# Patient Record
Sex: Male | Born: 1950 | Race: Black or African American | Hispanic: No | Marital: Single | Smoking: Never smoker
Health system: Southern US, Community
[De-identification: ages and names within clinical notes are randomized; demographics above are authoritative.]

## PROBLEM LIST (undated history)

## (undated) DIAGNOSIS — I10 Essential (primary) hypertension: Secondary | ICD-10-CM

## (undated) DIAGNOSIS — N529 Male erectile dysfunction, unspecified: Secondary | ICD-10-CM

## (undated) DIAGNOSIS — K649 Unspecified hemorrhoids: Secondary | ICD-10-CM

## (undated) DIAGNOSIS — E119 Type 2 diabetes mellitus without complications: Secondary | ICD-10-CM

## (undated) DIAGNOSIS — C801 Malignant (primary) neoplasm, unspecified: Secondary | ICD-10-CM

## (undated) HISTORY — DX: Male erectile dysfunction, unspecified: N52.9

## (undated) HISTORY — DX: Essential (primary) hypertension: I10

## (undated) HISTORY — DX: Type 2 diabetes mellitus without complications: E11.9

## (undated) HISTORY — DX: Unspecified hemorrhoids: K64.9

---

## 2012-01-20 ENCOUNTER — Encounter: Payer: Self-pay | Admitting: Gastroenterology

## 2012-02-21 ENCOUNTER — Ambulatory Visit: Payer: Self-pay | Admitting: Gastroenterology

## 2012-03-09 ENCOUNTER — Encounter: Payer: Self-pay | Admitting: Gastroenterology

## 2012-03-09 ENCOUNTER — Telehealth: Payer: Self-pay

## 2012-03-09 ENCOUNTER — Ambulatory Visit (INDEPENDENT_AMBULATORY_CARE_PROVIDER_SITE_OTHER): Payer: PRIVATE HEALTH INSURANCE | Admitting: Gastroenterology

## 2012-03-09 VITALS — BP 140/80 | HR 60 | Ht 68.0 in | Wt 143.0 lb

## 2012-03-09 DIAGNOSIS — R197 Diarrhea, unspecified: Secondary | ICD-10-CM

## 2012-03-09 DIAGNOSIS — K625 Hemorrhage of anus and rectum: Secondary | ICD-10-CM

## 2012-03-09 NOTE — Patient Instructions (Addendum)
You will be set up for a colonoscopy for bleeding, diarrhea.  Done at [facility] with propofol.  We will call you to schedule this procedure. You will have labs checked today in the basement lab.  Please head down after you check out with the front desk  (ova, parasites, stool culture, fecal lukocytes. Stool for C. Diff by PCR).

## 2012-03-09 NOTE — Telephone Encounter (Signed)
Pt needs to be scheduled for colon with MAC at [facility]

## 2012-03-09 NOTE — Progress Notes (Signed)
  HPI: This is a  shifty 61 year old man whom I am meeting for the site first time today  He has been having diarrhea and blood in stool.  This has been going on for 5 years.  Has never had colonoscopy. He thinks he has a hemorrhoid problem. He sees "worms" in his stool.  Looks like "very tiny" worms.  HE will have 20 diarrhea stools a day.  Overall he has lost 60 pounds in 5 years.  Not trying to lose weight.  At times he can be constipated as well.   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  Very scattered.  Avoids eye contact.    12/2011 labs show Hb 12.2, normal MCV, plt 426.  CMET normal   he asked for narcotic pain medicines for his anal discomfort, ran out of the pills given to him by urgent clinic    Review of systems: Pertinent positive and negative review of systems were noted in the above HPI section. Complete review of systems was performed and was otherwise normal.    Past Medical History  Diagnosis Date  . Hypertension   . DM (diabetes mellitus)   . Hemorrhoids   . ED (erectile dysfunction)     History reviewed. No pertinent past surgical history.  Current Outpatient Prescriptions  Medication Sig Dispense Refill  . atenolol (TENORMIN) 25 MG tablet Take 25 mg by mouth daily.      . hydrochlorothiazide (HYDRODIURIL) 25 MG tablet Take 25 mg by mouth daily.        Allergies as of 03/09/2012  . (No Known Allergies)    History reviewed. No pertinent family history.  History   Social History  . Marital Status: Widowed    Spouse Name: N/A    Number of Children: 2  . Years of Education: N/A   Occupational History  . student     rn   Social History Main Topics  . Smoking status: Never Smoker   . Smokeless tobacco: Never Used  . Alcohol Use: No  . Drug Use: No  . Sexually Active: Not on file   Other Topics Concern  . Not on file   Social History Narrative  . No narrative on file        Physical Exam: BP 140/80  Pulse 60  Ht 5\' 8"  (1.727 m)  Wt 143 lb (64.864 kg)  BMI 21.74 kg/m2 Constitutional: generally well-appearing Psychiatric: alert and oriented x3 Eyes: extraocular movements intact Mouth: oral pharynx moist, no lesions Neck: supple no lymphadenopathy Cardiovascular: heart regular rate and rhythm Lungs: clear to auscultation bilaterally Abdomen: soft, nontender, nondistended, no obvious ascites, no peritoneal signs, normal bowel sounds Extremities: no lower extremity edema bilaterally Skin: no lesions on visible extremities Rectal exam deferred for upcoming colonoscopy, patient agreed   Assessment and plan: 61 y.o. male with  5 years of diarrhea, rectal bleeding, anal discomfort  Need to proceed with colonoscopy at his soonest convenience.  He believes he sees worms in his stool which may indeed be true. He'll get stool testing today to check for that. 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

## 2012-03-12 ENCOUNTER — Other Ambulatory Visit: Payer: PRIVATE HEALTH INSURANCE

## 2012-03-12 ENCOUNTER — Other Ambulatory Visit: Payer: Self-pay | Admitting: Gastroenterology

## 2012-03-12 DIAGNOSIS — R197 Diarrhea, unspecified: Secondary | ICD-10-CM

## 2012-03-13 NOTE — Telephone Encounter (Signed)
Left message on machine to call back  

## 2012-03-15 LAB — OVA AND PARASITE SCREEN: OP: NONE SEEN

## 2012-03-15 NOTE — Telephone Encounter (Signed)
Left message on machine to call back  

## 2012-03-15 NOTE — Telephone Encounter (Signed)
Please call the patient. Stool testing all normal so far. No sign of infection (worms or otherwise). Should continue with the suggestions outlined at recent visit. Left message on machine to call back

## 2012-03-16 LAB — STOOL CULTURE

## 2012-03-16 NOTE — Telephone Encounter (Signed)
Unable to reach pt letter mailed 

## 2012-05-28 ENCOUNTER — Ambulatory Visit: Payer: PRIVATE HEALTH INSURANCE | Admitting: Gastroenterology

## 2012-07-19 ENCOUNTER — Telehealth: Payer: Self-pay | Admitting: Gastroenterology

## 2012-07-19 NOTE — Telephone Encounter (Signed)
Message copied by Arna Snipe on Thu Jul 19, 2012 11:57 AM ------      Message from: Donata Duff      Created: Mon May 28, 2012  2:52 PM       Do not bill

## 2013-04-03 ENCOUNTER — Telehealth: Payer: Self-pay | Admitting: Gastroenterology

## 2013-04-03 ENCOUNTER — Telehealth: Payer: Self-pay

## 2013-04-03 NOTE — Telephone Encounter (Signed)
Pt has been scheduled with Shanda Bumps for tomorrow at 9 am pt will arrive 845 am

## 2013-04-03 NOTE — Telephone Encounter (Signed)
ok 

## 2013-04-03 NOTE — Telephone Encounter (Signed)
Dr Christella Hartigan this pt is not the correct pt, I have placed a call to the correct pt and will file this in error

## 2013-04-03 NOTE — Telephone Encounter (Signed)
Patty, this is from [employer]. Can you please try to get in touch with Mr. Leece.  I saw him in 2013, recommended colonoscopy however he never scheduled it and did not show for office follow up appt.  He needs rov with me or extender.  Thanks   Daphene Jaeger     ----------------------------------------------------------------------------------------------------------------------------- Hi,  The below name is the patient I contacted you about earlier. He was seen in the [facility] last night and could not be examined due to a large rectal mass. The urologist who saw him wanted him referred to GI ASAP. He has [employer].   FYI--there is another patient in the system with the same birthday, first and last name, but it is not this patient. He is not currently listed in the EPIC system.   Please call and schedule the patient. [name] in [place] attempted to contact him without success this afternoon.   Please let me know if I can help.  Thanks,  [name], [name].  DOB 09/05/1950  phone # [phone]  Has BCBS

## 2013-04-03 NOTE — Telephone Encounter (Signed)
Brendan Mosley, this is from [employer]. Can you please try to get in touch with [name]. I saw him in 2013, recommended colonoscopy however he never scheduled it and did not show for office follow up appt. He needs rov with me or extender.  Thanks  Daphene Jaeger  -----------------------------------------------------------------------------------------------------------------------------  Hi,  The below name is the patient I contacted you about earlier. He was seen in the [facility] Screening Clinic last night and could not be examined due to a large rectal mass. The urologist who saw him wanted him referred to GI ASAP. He has [employer].   FYI--there is another patient in the system with the same birthday, first and last name, but it is not this patient. He is not currently listed in the EPIC system.   Please call and schedule the patient. [name] in [place] attempted to contact him without success this afternoon.   Please let me know if I can help.  Thanks,  [name], [name].  DOB 07-07-1950  phone # [phone]  Has BCBS            Encounter MyChart Messages    No messages in this encounter         Routing History

## 2013-04-04 ENCOUNTER — Encounter: Payer: Self-pay | Admitting: Nurse Practitioner

## 2013-04-04 ENCOUNTER — Ambulatory Visit (INDEPENDENT_AMBULATORY_CARE_PROVIDER_SITE_OTHER): Payer: BC Managed Care – PPO | Admitting: Nurse Practitioner

## 2013-04-04 VITALS — BP 140/78 | HR 65 | Ht 68.0 in | Wt 147.0 lb

## 2013-04-04 DIAGNOSIS — R198 Other specified symptoms and signs involving the digestive system and abdomen: Secondary | ICD-10-CM

## 2013-04-04 DIAGNOSIS — K625 Hemorrhage of anus and rectum: Secondary | ICD-10-CM

## 2013-04-04 DIAGNOSIS — R6889 Other general symptoms and signs: Secondary | ICD-10-CM

## 2013-04-04 DIAGNOSIS — K6289 Other specified diseases of anus and rectum: Secondary | ICD-10-CM

## 2013-04-04 MED ORDER — MOVIPREP 100 G PO SOLR: 1.0000 | Freq: Once | ORAL | Status: DC

## 2013-04-04 MED ORDER — HYDROCODONE-ACETAMINOPHEN 5-325 MG PO TABS: 1.0000 | ORAL_TABLET | Freq: Four times a day (QID) | ORAL | Status: DC | PRN

## 2013-04-04 NOTE — Patient Instructions (Addendum)
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  We have given you the prescriptions to take to your pharmacy, [facility] [address]  You have been scheduled for a colonoscopy with propofol. Please follow written instructions given to you at your visit today.  Please pick up your prep kit at the pharmacy within the next 1-3 days. If you use inhalers (even only as needed), please bring them with you on the day of your procedure.

## 2013-04-04 NOTE — Progress Notes (Signed)
  History of Present Illness:  Patient is a 62 year old male here for evaluation of rectal bleeding and an "obstruction" found during DRE for prostate screening. Patient doesn't have a PCP but heard about a free prostate cancer screening in [facility] which he attended. Apparently patient found to have an abnormal DRE. Patient reports a 5 year history of rectal bleeding with defecation. Over the last two months he has had excruciating rectal pain which is nearly constant. BMs have alternated between constipation and diarrhea for last five years.  No recent weight loss though 3 years ago he lost about 30 pounds.    Current Medications, Allergies, Past Medical History, Past Surgical History, Family History and Social History were reviewed in [employer] record.  Physical Exam: General: Thin black male in no acute distress Head: Normocephalic and atraumatic Eyes:  sclerae anicteric, conjunctiva pink  Ears: Normal auditory acuity Lungs: Clear throughout to auscultation Heart: Regular rate and rhythm Abdomen: Soft, non distended, non-tender. No masses, no hepatomegaly. Normal bowel sounds Rectal: no external lesions. Upon entering the anus there was a partially obstructing, tender mass located on anterior wall.  Musculoskeletal: Symmetrical with no gross deformities  Extremities: No edema  Neurological: Alert oriented x 4, grossly nonfocal Psychological:  Alert and cooperative. Normal mood and affect  Assessment and Recommendations:   62 year old black male with chronic rectal bleeding, now with rectal pain and a partially obstructing anal wall lesion on digital exam. Patient needs lower endoscopy for further evaluation. The risks, benefits, and alternatives to colonoscopy with possible biopsy and possible polypectomy were discussed with the patient and he consents to proceed. Procedure scheduled for tomorrow morning with Dr. Christella Hartigan.  It should be mentioned that a patient  with same name, history and demographics was seen by Dr. Christella Hartigan in September of last year for evaluation of rectal bleeding. Colonoscopy was recommended, patient apparently canceled procedure and not been seen since. This patient today adamantly denies EVER having been seen here.

## 2013-04-05 ENCOUNTER — Ambulatory Visit (INDEPENDENT_AMBULATORY_CARE_PROVIDER_SITE_OTHER)
Admission: RE | Admit: 2013-04-05 | Discharge: 2013-04-05 | Disposition: A | Discharge status: Home / Self Care | Payer: BC Managed Care – PPO | Source: Ambulatory Visit | Attending: Gastroenterology | Admitting: Gastroenterology

## 2013-04-05 ENCOUNTER — Other Ambulatory Visit (INDEPENDENT_AMBULATORY_CARE_PROVIDER_SITE_OTHER): Payer: BC Managed Care – PPO

## 2013-04-05 ENCOUNTER — Ambulatory Visit (AMBULATORY_SURGERY_CENTER): Payer: BC Managed Care – PPO | Admitting: Gastroenterology

## 2013-04-05 ENCOUNTER — Other Ambulatory Visit: Payer: BC Managed Care – PPO

## 2013-04-05 ENCOUNTER — Other Ambulatory Visit: Payer: Self-pay

## 2013-04-05 ENCOUNTER — Encounter: Payer: BC Managed Care – PPO | Admitting: Gastroenterology

## 2013-04-05 ENCOUNTER — Encounter: Payer: Self-pay | Admitting: Gastroenterology

## 2013-04-05 VITALS — BP 159/86 | HR 77 | Temp 98.4°F | Resp 30 | Ht 68.0 in | Wt 147.0 lb

## 2013-04-05 DIAGNOSIS — R198 Other specified symptoms and signs involving the digestive system and abdomen: Secondary | ICD-10-CM

## 2013-04-05 DIAGNOSIS — K6289 Other specified diseases of anus and rectum: Secondary | ICD-10-CM

## 2013-04-05 DIAGNOSIS — C801 Malignant (primary) neoplasm, unspecified: Secondary | ICD-10-CM | POA: Insufficient documentation

## 2013-04-05 HISTORY — DX: Malignant (primary) neoplasm, unspecified: C80.1

## 2013-04-05 LAB — CBC WITH DIFFERENTIAL/PLATELET
Basophils Absolute: 0.1 10*3/uL (ref 0.0–0.1)
Eosinophils Absolute: 0.2 10*3/uL (ref 0.0–0.7)
Eosinophils Relative: 1.8 % (ref 0.0–5.0)
Hemoglobin: 11.1 g/dL — ABNORMAL LOW (ref 13.0–17.0)
Lymphs Abs: 1.5 10*3/uL (ref 0.7–4.0)
MCHC: 33.2 g/dL (ref 30.0–36.0)
Monocytes Relative: 7.7 % (ref 3.0–12.0)
Neutro Abs: 7.5 10*3/uL (ref 1.4–7.7)
Neutrophils Relative %: 74.4 % (ref 43.0–77.0)
Platelets: 419 10*3/uL — ABNORMAL HIGH (ref 150.0–400.0)
RDW: 14.9 % — ABNORMAL HIGH (ref 11.5–14.6)
WBC: 10.1 10*3/uL (ref 4.5–10.5)

## 2013-04-05 LAB — COMPREHENSIVE METABOLIC PANEL
ALT: 20 U/L (ref 0–53)
Albumin: 3.8 g/dL (ref 3.5–5.2)
Alkaline Phosphatase: 61 U/L (ref 39–117)
CO2: 24 mEq/L (ref 19–32)
Glucose, Bld: 87 mg/dL (ref 70–99)
Potassium: 3.6 mEq/L (ref 3.5–5.1)
Sodium: 144 mEq/L (ref 135–145)
Total Bilirubin: 0.5 mg/dL (ref 0.3–1.2)
Total Protein: 6.9 g/dL (ref 6.0–8.3)

## 2013-04-05 MED ORDER — SODIUM CHLORIDE 0.9 % IV SOLN: 500.0000 mL | INTRAVENOUS | Status: DC

## 2013-04-05 MED ORDER — IOHEXOL 300 MG/ML  SOLN
100.0000 mL | Freq: Once | INTRAMUSCULAR | Status: AC | PRN
  Administered 2013-04-05: 100 mL via INTRAVENOUS

## 2013-04-05 NOTE — Progress Notes (Signed)
Patient sedated , yet remained restless throughout procedure. Not listening to instruction, resp. Deep even, abdoman soft to touch. past procedure drowsy opens eyes to name call and patient is peaceful

## 2013-04-05 NOTE — Op Note (Signed)
[facility] [address] [place], [address]   COLONOSCOPY PROCEDURE REPORT  PATIENT: Brendan Mosley, Brendan Mosley  MR#: [id number] BIRTHDATE: 1950-11-14 , 62  yrs. old GENDER: Male ENDOSCOPIST: Rachael Fee, MD PROCEDURE DATE:  04/05/2013 PROCEDURE:   Colonoscopy with biopsy First Screening Colonoscopy - Avg.  risk and is 50 yrs.  old or older - No.  Prior Negative Screening - Now for repeat screening. N/A  History of Adenoma - Now for follow-up colonoscopy & has been > or = to 3 yrs.  N/A  Polyps Removed Today? No.  Recommend repeat exam, <10 yrs? Yes.  High risk (family or personal hx). ASA CLASS:   Class II INDICATIONS:diarrhea, rectal mass. MEDICATIONS: Fentanyl 100 mcg IV, Versed 10 mg IV, and Benadryl 25 mg IV  DESCRIPTION OF PROCEDURE:   After the risks benefits and alternatives of the procedure were thoroughly explained, informed consent was obtained.  A digital rectal exam revealed a palpable rectal mass.   The [id number]  endoscope was introduced through the anus and advanced to the cecum, which was identified by both the appendix and ileocecal valve. No adverse events experienced.   The quality of the prep was fair.  The instrument was then slowly withdrawn as the colon was fully examined.  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  The lumen through the stricture was 7mm across, only able to be traversed by gastroscope.  The remaining colon mucosa showed melanosis changes but was otherwise normal.  This was limited by only a FAIR to POOR prep and so small lesions may have been missed.  Retroflexion was not performed. The time to cecum=9 minutes 50 seconds.  Withdrawal time=8 minutes 00 seconds.  The scope was withdrawn and the procedure completed. COMPLICATIONS: There were no complications.  ENDOSCOPIC IMPRESSION: There was a fairly tight circumferential rectal  stricture from the anus up to 8cm from the anal verge.  The lumen through the stricture was 7mm across, only able to be traversed by gastroscope. I suspect the stricture is from a large circumferential mass (vs. severe proctitis).  The stricture was biopsied extensively.  The remaining colon mucosa showed melanosis changes but was otherwise normal.  This was limited by only a FAIR to POOR prep and so small lesions may have been missed.  RECOMMENDATIONS: My office will set up staging CT scan (chest, abd, pelvis with PO and IV contrast), CEA level, other basic labs including CBC, CMET. My office will also begin referral process to medical and radiation oncology (will decide on surgical referral after staging complete).   eSigned:  Rachael Fee, MD 04/05/2013 8:34 AM

## 2013-04-05 NOTE — Progress Notes (Signed)
I agree with note.  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

## 2013-04-05 NOTE — Progress Notes (Addendum)
Patient did not have preoperative order for IV antibiotic SSI prophylaxis. [phone])   Patient is HIPPA. Requests that his Fiance knows NOTHING about his condition.  Patient did not experience any of the following events: a burn prior to discharge; a fall within the facility; wrong site/side/patient/procedure/implant event; or a hospital transfer or hospital admission upon discharge from the facility. [phone])  Patient was given contrast and sheet.  Explained that the nurse from 3rd floor would call him with details,

## 2013-04-05 NOTE — Patient Instructions (Signed)
YOU HAD AN ENDOSCOPIC PROCEDURE TODAY AT THE [facility]: Refer to the procedure report that was given to you for any specific questions about what was found during the examination.  If the procedure report does not answer your questions, please call your gastroenterologist to clarify.  If you requested that your care partner not be given the details of your procedure findings, then the procedure report has been included in a sealed envelope for you to review at your convenience later.  YOU SHOULD EXPECT: Some feelings of bloating in the abdomen. Passage of more gas than usual.  Walking can help get rid of the air that was put into your GI tract during the procedure and reduce the bloating. If you had a lower endoscopy (such as a colonoscopy or flexible sigmoidoscopy) you may notice spotting of blood in your stool or on the toilet paper. If you underwent a bowel prep for your procedure, then you may not have a normal bowel movement for a few days.  DIET: Your first meal following the procedure should be a light meal and then it is ok to progress to your normal diet.  A half-sandwich or bowl of soup is an example of a good first meal.  Heavy or fried foods are harder to digest and may make you feel nauseous or bloated.  Likewise meals heavy in dairy and vegetables can cause extra gas to form and this can also increase the bloating.  Drink plenty of fluids but you should avoid alcoholic beverages for 24 hours.  ACTIVITY: Your care partner should take you home directly after the procedure.  You should plan to take it easy, moving slowly for the rest of the day.  You can resume normal activity the day after the procedure however you should NOT DRIVE or use heavy machinery for 24 hours (because of the sedation medicines used during the test).    SYMPTOMS TO REPORT IMMEDIATELY: A gastroenterologist can be reached at any hour.  During normal business hours, 8:30 AM to 5:00 PM Monday through Friday,  call [phone].  After hours and on weekends, please call the GI answering service at [phone] who will take a message and have the physician on call contact you.   Following lower endoscopy (colonoscopy or flexible sigmoidoscopy):  Excessive amounts of blood in the stool  Significant tenderness or worsening of abdominal pains  Swelling of the abdomen that is new, acute  Fever of 100F or higher  FOLLOW UP: If any biopsies were taken you will be contacted by phone or by letter within the next 1-3 weeks.  Call your gastroenterologist if you have not heard about the biopsies in 3 weeks.  Our staff will call the home number listed on your records the next business day following your procedure to check on you and address any questions or concerns that you may have at that time regarding the information given to you following your procedure. This is a courtesy call and so if there is no answer at the home number and we have not heard from you through the emergency physician on call, we will assume that you have returned to your regular daily activities without incident.  SIGNATURES/CONFIDENTIALITY: You and/or your care partner have signed paperwork which will be entered into your electronic medical record.  These signatures attest to the fact that that the information above on your After Visit Summary has been reviewed and is understood.  Full responsibility of the confidentiality of this   discharge information lies with you and/or your care-partner.   Our office will set up a CT of the chest, abd, pelvis with contrast.  Labs will be done, and an oncology referral with be scheduled.  The RN from the 3rd floor will contact you with the details.  You will go to the lab today for your labwork.

## 2013-04-05 NOTE — Progress Notes (Signed)
You have been scheduled for a CT scan of the abdomen and pelvis at [facility] ([address]---this is in the same building as [facility]).   You are scheduled on TODAY at 3 pm. You should arrive 15 minutes prior to your appointment time for registration. Please follow the written instructions below on the day of your exam:  WARNING: IF YOU ARE ALLERGIC TO IODINE/X-RAY DYE, PLEASE NOTIFY RADIOLOGY IMMEDIATELY AT [phone]! YOU WILL BE GIVEN A 13 HOUR PREMEDICATION PREP.  1) Do not eat or drink anything after 11 am (4 hours prior to your test) 2) You have been given 2 bottles of oral contrast to drink. The solution may taste better if refrigerated, but do NOT add ice or any other liquid to this solution. Shake well before drinking.    Drink 1 bottle of contrast @ 1 pm (2 hours prior to your exam)  Drink 1 bottle of contrast @ 2 pm (1 hour prior to your exam)  You may take any medications as prescribed with a small amount of water except for the following: Metformin, Glucophage, Glucovance, Avandamet, Riomet, Fortamet, Actoplus Met, Janumet, Glumetza or Metaglip. The above medications must be held the day of the exam AND 48 hours after the exam.  The purpose of you drinking the oral contrast is to aid in the visualization of your intestinal tract. The contrast solution may cause some diarrhea. Before your exam is started, you will be given a small amount of fluid to drink. Depending on your individual set of symptoms, you may also receive an intravenous injection of x-ray contrast/dye. Plan on being at [facility] for 30 minutes or long, depending on the type of exam you are having performed.  This test typically takes 30-45 minutes to complete.  If you have any questions regarding your exam or if you need to reschedule, you may call the CT department at [phone] between the hours of 8:00 am and 5:00 pm,  Monday-Friday.  ________________________________________________________________________ Pt has been notified and instructed

## 2013-04-06 LAB — CEA: CEA: 0.9 ng/mL (ref 0.0–5.0)

## 2013-04-08 ENCOUNTER — Encounter: Payer: Self-pay | Admitting: Radiation Oncology

## 2013-04-08 ENCOUNTER — Telehealth: Payer: Self-pay | Admitting: *Deleted

## 2013-04-08 NOTE — Progress Notes (Signed)
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   Past/Anticipated interventions by urology, if any: Past/Anticipated interventions by medical oncology, if any: free prostate  screening for PSA in [place] =abnormal DRE   Weight changes, if any:no,  lost 30 lbs in past 2-3 years,  not trying to lose weight, none in last month,   Bowel/Bladder complaints, if any: constipation/diarrhea with  Chronic rectal bleeding, states 10-15  diarrhea stools day now, doesn't take imodium ad, sometimes constipation, blood in stool every other time stated Nausea/Vomiting, if any:   Pain issues, if any:  excruciating rectal pain constant SAFETY ISSUES:no  Prior radiation? no  Pacemaker/ICD? no  Is the patient on methotrexate? no  Current Complaints / other details:  Widowed, 2 children,  Has fiance doesn't want them to know of his current condition, inever smoker,no alcohol or drug abuse,   CT abd/pelvis 04/05/13:1. Marked irregular circumferential wall thickening of the rectum with an associated mass measuring up to 2.2 cm concerning for primary rectal carcinoma. There are multiple enlarged perirectal  lymph nodes concerning for local regional metastatic disease. Additionally there is an regular low-attenuation lesion within the anterior right hepatic lobe concerning for hepatic metastatic disease. 2. Small 2 cm fluid collection with a few foci of gas adjacent to the distal rectum/ anus may be sequelae of necrotic tumor versus small perirectal abscess. 3. There is mild dilation of the pancreatic duct throughout the pancreatic parenchyma measuring up to 5 mm within the region of the  head and uncinate process. No causative etiology is identified.Marland Kitchen

## 2013-04-08 NOTE — Telephone Encounter (Signed)
  Follow up Call-  Call back number 04/05/2013  Post procedure Call Back phone  # [phone]  do not leave a message  Permission to leave phone message No    No answer; no message left

## 2013-04-09 ENCOUNTER — Telehealth: Payer: Self-pay | Admitting: *Deleted

## 2013-04-09 ENCOUNTER — Telehealth: Payer: Self-pay | Admitting: Gastroenterology

## 2013-04-09 NOTE — Telephone Encounter (Signed)
Spoke with patient by phone and confirmed appointments with Dr. Mitzi Hansen and Dr. Truett Perna.  Directions, Contact names and numbers were provided.

## 2013-04-09 NOTE — Telephone Encounter (Signed)
Pt received a call from Vicie Mutters at the cancer center he was given that information and will call her back

## 2013-04-10 ENCOUNTER — Ambulatory Visit
Admission: RE | Admit: 2013-04-10 | Discharge: 2013-04-10 | Disposition: A | Discharge status: Home / Self Care | Payer: BC Managed Care – PPO | Source: Ambulatory Visit | Attending: Radiation Oncology | Admitting: Radiation Oncology

## 2013-04-10 ENCOUNTER — Encounter: Payer: Self-pay | Admitting: Radiation Oncology

## 2013-04-10 VITALS — BP 151/75 | HR 66 | Temp 99.0°F | Resp 16 | Wt 147.9 lb

## 2013-04-10 DIAGNOSIS — E119 Type 2 diabetes mellitus without complications: Secondary | ICD-10-CM | POA: Insufficient documentation

## 2013-04-10 DIAGNOSIS — Z79899 Other long term (current) drug therapy: Secondary | ICD-10-CM | POA: Insufficient documentation

## 2013-04-10 DIAGNOSIS — K6289 Other specified diseases of anus and rectum: Secondary | ICD-10-CM

## 2013-04-10 DIAGNOSIS — K7689 Other specified diseases of liver: Secondary | ICD-10-CM | POA: Insufficient documentation

## 2013-04-10 DIAGNOSIS — C20 Malignant neoplasm of rectum: Secondary | ICD-10-CM | POA: Insufficient documentation

## 2013-04-10 DIAGNOSIS — I10 Essential (primary) hypertension: Secondary | ICD-10-CM | POA: Insufficient documentation

## 2013-04-10 HISTORY — DX: Malignant (primary) neoplasm, unspecified: C80.1

## 2013-04-10 MED ORDER — HYDROCODONE-ACETAMINOPHEN 5-325 MG PO TABS: 2.0000 | ORAL_TABLET | Freq: Four times a day (QID) | ORAL | Status: DC | PRN

## 2013-04-10 NOTE — Progress Notes (Signed)
Please see the Nurse Progress Note in the MD Initial Consult Encounter for this patient. 

## 2013-04-10 NOTE — Progress Notes (Signed)
Radiation Oncology         [phone] ________________________________  Name: Brendan Mosley MRN: [id number]  Date: 04/10/2013  DOB: [birth year]  CC:No PCP Per Patient  Rachael Fee, MD   G. Rolm Baptise, M.D.  REFERRING PHYSICIAN: Rachael Fee, MD   DIAGNOSIS: The primary encounter diagnosis was Rectal mass. A diagnosis of Malignant neoplasm of rectum was also pertinent to this visit.   HISTORY OF PRESENT ILLNESS::Brendan Mosley is a 62 y.o. male who is seen for an initial consultation visit. The patient has had a history of rectal bleeding for a prolonged period of time, possibly up to 5 years. More recently he has had some bowel changes alternating between constipation and diarrhea as well as significant rectal pain. He states that this pain is now nearly constant. The patient was found to have an abnormal digital rectal exam and he underwent a colonoscopy with biopsy on 04/05/2013. This showed a fairly tight circumferential rectal stricture from the anus to 8 cm proximal to the anal her. The stricture was biopsied extensively and has returned positive for adenocarcinoma.  Imaging has included a CT scan of the chest abdomen and pelvis. Marketed irregular wall thickening of the rectum is present with multiple enlarged perirectal lymph nodes. There also is a low attenuation lesion within the anterior right hepatic lobe which is also concerning for hepatic metastatic disease. Mild dilatation of the pancreatic duct is also present measuring up to 5 mm without any positive in the allergy seen.  The patient for is seen today for consideration of possible radiation treatment as part of his overall treatment plan. He is also scheduled to see medical oncology next week.   PREVIOUS RADIATION THERAPY: No   PAST MEDICAL HISTORY:  has a past medical history of Hemorrhoids; ED (erectile dysfunction); Hypertension; Cancer (04/05/13); and DM (diabetes mellitus).     PAST SURGICAL  HISTORY:No past surgical history on file.   FAMILY HISTORY: family history includes Hypertension in his father.   SOCIAL HISTORY:  reports that he has never smoked. He has never used smokeless tobacco. He reports that he does not drink alcohol or use illicit drugs.   ALLERGIES: Review of patient's allergies indicates no known allergies.   MEDICATIONS:  Current Outpatient Prescriptions  Medication Sig Dispense Refill  . atenolol (TENORMIN) 25 MG tablet Take 25 mg by mouth daily.      . hydrochlorothiazide (HYDRODIURIL) 25 MG tablet Take 25 mg by mouth daily.      Marland Kitchen HYDROcodone-acetaminophen (NORCO/VICODIN) 5-325 MG per tablet Take 2 tablets by mouth every 6 (six) hours as needed for pain.  60 tablet  0   No current facility-administered medications for this encounter.     REVIEW OF SYSTEMS:  A 15 point review of systems is documented in the electronic medical record. This was obtained by the nursing staff. However, I reviewed this with the patient to discuss relevant findings and make appropriate changes.  Pertinent items are noted in HPI.    PHYSICAL EXAM:  weight is 147 lb 14.4 oz (67.087 kg). His oral temperature is 99 F (37.2 C). His blood pressure is 151/75 and his pulse is 66. His respiration is 16 and oxygen saturation is 99%.   General: Well-developed, in no acute distress HEENT: Normocephalic, atraumatic; oral cavity clear Neck: Supple without any lymphadenopathy Cardiovascular: Regular rate and rhythm Respiratory: Clear to auscultation bilaterally GI: Soft, nontender, normal bowel sounds Extremities: No edema present Neuro: No focal deficits Rectal: The patient  has palpable tumor extending to the anal verge. Circumferential tumor which is very tight and was painful on exam. I was unable to complete a digital rectal exam. No tumor externally extending to the anal canal.    LABORATORY DATA:  Lab Results  Component Value Date   WBC 10.1 04/05/2013   HGB 11.1*  04/05/2013   HCT 33.5* 04/05/2013   MCV 82.8 04/05/2013   PLT 419.0* 04/05/2013   Lab Results  Component Value Date   NA 144 04/05/2013   K 3.6 04/05/2013   CL 111 04/05/2013   CO2 24 04/05/2013   Lab Results  Component Value Date   ALT 20 04/05/2013   AST 20 04/05/2013   ALKPHOS 61 04/05/2013   BILITOT 0.5 04/05/2013     CEA: 0.9  RADIOGRAPHY: Ct Chest W Contrast  04/05/2013   ADDENDUM REPORT: 04/05/2013 17:22  ADDENDUM: Addition to impression:  4. There is a 1.2 cm indeterminate left adrenal nodule.   Electronically Signed   By: Annia Belt M.D.   On: 04/05/2013 17:22   04/05/2013   CLINICAL DATA:  Rectal mass  EXAM: CT CHEST, ABDOMEN, AND PELVIS WITH CONTRAST  TECHNIQUE: Multidetector CT imaging of the chest, abdomen and pelvis was performed following the standard protocol during bolus administration of intravenous contrast.  CONTRAST:  OMNIPAQUE IOHEXOL 300 MG/ML  SOLN  COMPARISON:  None  FINDINGS: CT CHEST FINDINGS  Visualized neck base is unremarkable. No enlarged axillary, mediastinal or hilar lymphadenopathy. Normal heart size. No pericardial effusion.  Central airways are patent. No consolidative or nodular pulmonary opacities. No pleural effusion pneumothorax.  CT ABDOMEN AND PELVIS FINDINGS  Within the right hepatic lobe there is a 2.7 x 1.9 cm irregular low-attenuation lesion (image 59; series 2). Portal and hepatic veins are patent. Gallbladder is unremarkable. The spleen is unremarkable. There is mild dilatation of the pancreatic duct without definitive causing etiology identified. This measures up to 5 mm within the region of the pancreatic head and uncinate process. There is a 1.2 cm left adrenal nodule. Kidneys enhance symmetrically with contrast. No hydronephrosis.  Normal caliber abdominal aorta. No retroperitoneal lymphadenopathy. Urinary bladder is unremarkable. Prostate is enlarged measuring up to 4.5 cm.  There is marked irregular circumferential wall thickening  of the rectum with a 2.2 x 1.4 cm mass emanating from the left lateral aspect of the rectum. There is a 1.1 cm enlarged perirectal lymph node (image 103; series 2) and more superiorly a 1.1 cm perirectal lymph node (image 98; series 2). Normal appendix. Stool within the cecum and ascending colon. No evidence for bowel obstruction. No free intraperitoneal air. Additionally along the right lateral aspect of the rectum there is a 2 cm low attenuation collection with all a few foci of gas (image 120; series 2).  Lower lumbar spine degenerative change. Bilateral hip joint degenerative change.  IMPRESSION: 1. Marked irregular circumferential wall thickening of the rectum with an associated mass measuring up to 2.2 cm concerning for primary rectal carcinoma. There are multiple enlarged perirectal lymph nodes concerning for local regional metastatic disease. Additionally there is an regular low-attenuation lesion within the anterior right hepatic lobe concerning for hepatic metastatic disease. 2. Small 2 cm fluid collection with a few foci of gas adjacent to the distal rectum/ anus may be sequelae of necrotic tumor versus small perirectal abscess. 3. There is mild dilation of the pancreatic duct throughout the pancreatic parenchyma measuring up to 5 mm within the region of the head and  uncinate process. No causative etiology is identified. Recommend correlation with laboratory analysis and possible MRI/ MRCP as clinically indicated. These results will be called to the ordering clinician or representative by the Radiologist Assistant, and communication documented in the PACS Dashboard.  Electronically Signed: By: Annia Belt M.D. On: 04/05/2013 16:15   Ct Abdomen Pelvis W Contrast  04/05/2013   ADDENDUM REPORT: 04/05/2013 17:22  ADDENDUM: Addition to impression:  4. There is a 1.2 cm indeterminate left adrenal nodule.   Electronically Signed   By: Annia Belt M.D.   On: 04/05/2013 17:22   04/05/2013   CLINICAL DATA:   Rectal mass  EXAM: CT CHEST, ABDOMEN, AND PELVIS WITH CONTRAST  TECHNIQUE: Multidetector CT imaging of the chest, abdomen and pelvis was performed following the standard protocol during bolus administration of intravenous contrast.  CONTRAST:  OMNIPAQUE IOHEXOL 300 MG/ML  SOLN  COMPARISON:  None  FINDINGS: CT CHEST FINDINGS  Visualized neck base is unremarkable. No enlarged axillary, mediastinal or hilar lymphadenopathy. Normal heart size. No pericardial effusion.  Central airways are patent. No consolidative or nodular pulmonary opacities. No pleural effusion pneumothorax.  CT ABDOMEN AND PELVIS FINDINGS  Within the right hepatic lobe there is a 2.7 x 1.9 cm irregular low-attenuation lesion (image 59; series 2). Portal and hepatic veins are patent. Gallbladder is unremarkable. The spleen is unremarkable. There is mild dilatation of the pancreatic duct without definitive causing etiology identified. This measures up to 5 mm within the region of the pancreatic head and uncinate process. There is a 1.2 cm left adrenal nodule. Kidneys enhance symmetrically with contrast. No hydronephrosis.  Normal caliber abdominal aorta. No retroperitoneal lymphadenopathy. Urinary bladder is unremarkable. Prostate is enlarged measuring up to 4.5 cm.  There is marked irregular circumferential wall thickening of the rectum with a 2.2 x 1.4 cm mass emanating from the left lateral aspect of the rectum. There is a 1.1 cm enlarged perirectal lymph node (image 103; series 2) and more superiorly a 1.1 cm perirectal lymph node (image 98; series 2). Normal appendix. Stool within the cecum and ascending colon. No evidence for bowel obstruction. No free intraperitoneal air. Additionally along the right lateral aspect of the rectum there is a 2 cm low attenuation collection with all a few foci of gas (image 120; series 2).  Lower lumbar spine degenerative change. Bilateral hip joint degenerative change.  IMPRESSION: 1. Marked irregular  circumferential wall thickening of the rectum with an associated mass measuring up to 2.2 cm concerning for primary rectal carcinoma. There are multiple enlarged perirectal lymph nodes concerning for local regional metastatic disease. Additionally there is an regular low-attenuation lesion within the anterior right hepatic lobe concerning for hepatic metastatic disease. 2. Small 2 cm fluid collection with a few foci of gas adjacent to the distal rectum/ anus may be sequelae of necrotic tumor versus small perirectal abscess. 3. There is mild dilation of the pancreatic duct throughout the pancreatic parenchyma measuring up to 5 mm within the region of the head and uncinate process. No causative etiology is identified. Recommend correlation with laboratory analysis and possible MRI/ MRCP as clinically indicated. These results will be called to the ordering clinician or representative by the Radiologist Assistant, and communication documented in the PACS Dashboard.  Electronically Signed: By: Annia Belt M.D. On: 04/05/2013 16:15       IMPRESSION: The patient has a new diagnosis of adenocarcinoma of the rectum. The patient appears to be presenting with at least locally advanced disease  with evidence of regional adenopathy on CT imaging. One suspicious area within the liver was present. I would like to proceed with additional imaging including a PET scan to help clarify the liver lesion and also I believe he is at significant risk for other sites of distant disease as well. Notably however, the patient's CEA level was low at 0.9.   I discussed this with the patient. The patient does not appear to have grade insight into his disease at this time. We discussed that a curative approach versus a palliative approach would depend on his further workup. We did discuss therefore that this can be potentially life-threatening.  I did discuss with the patient a potential 5-1/2 week course of treatment consisting of  neoadjuvant chemoradiotherapy followed by surgical resection as a standard treatment plan for curative intent. We discussed some of the details of treatment including the benefit of radiotherapy in this setting in addition to the possible side effects and risks. All of the patient's questions were answered.   PLAN:  -PET scan as soon as possible -Simulation in the near future: I believe that the patient will benefit from radiation treatment either curative or palliative -Medical oncology appointment next week -Discussion in multidisciplinary GI conference next Wednesday    I spent 45 minutes face to face with the patient and more than 50% of that time was spent in counseling and/or coordination of care.    ________________________________   Radene Gunning, MD, PhD

## 2013-04-11 ENCOUNTER — Telehealth: Payer: Self-pay | Admitting: *Deleted

## 2013-04-11 NOTE — Telephone Encounter (Signed)
Called patient to inform of test, lvm for a return call 

## 2013-04-12 ENCOUNTER — Other Ambulatory Visit: Payer: Self-pay | Admitting: Radiation Oncology

## 2013-04-12 ENCOUNTER — Encounter (HOSPITAL_COMMUNITY): Payer: Self-pay

## 2013-04-12 ENCOUNTER — Ambulatory Visit (HOSPITAL_COMMUNITY)
Admission: RE | Admit: 2013-04-12 | Discharge: 2013-04-12 | Disposition: A | Discharge status: Home / Self Care | Payer: BC Managed Care – PPO | Source: Ambulatory Visit | Attending: Radiation Oncology | Admitting: Radiation Oncology

## 2013-04-12 ENCOUNTER — Encounter: Payer: Self-pay | Admitting: Radiation Oncology

## 2013-04-12 DIAGNOSIS — M19019 Primary osteoarthritis, unspecified shoulder: Secondary | ICD-10-CM | POA: Insufficient documentation

## 2013-04-12 DIAGNOSIS — K6289 Other specified diseases of anus and rectum: Secondary | ICD-10-CM

## 2013-04-12 DIAGNOSIS — J323 Chronic sphenoidal sinusitis: Secondary | ICD-10-CM | POA: Insufficient documentation

## 2013-04-12 DIAGNOSIS — C20 Malignant neoplasm of rectum: Secondary | ICD-10-CM | POA: Insufficient documentation

## 2013-04-12 DIAGNOSIS — R599 Enlarged lymph nodes, unspecified: Secondary | ICD-10-CM | POA: Insufficient documentation

## 2013-04-12 DIAGNOSIS — K769 Liver disease, unspecified: Secondary | ICD-10-CM | POA: Insufficient documentation

## 2013-04-12 DIAGNOSIS — J320 Chronic maxillary sinusitis: Secondary | ICD-10-CM | POA: Insufficient documentation

## 2013-04-12 LAB — GLUCOSE, CAPILLARY: Glucose-Capillary: 102 mg/dL — ABNORMAL HIGH (ref 70–99)

## 2013-04-12 MED ORDER — FLUDEOXYGLUCOSE F - 18 (FDG) INJECTION
19.6000 | Freq: Once | INTRAVENOUS | Status: AC | PRN
  Administered 2013-04-12: 19.6 via INTRAVENOUS

## 2013-04-12 NOTE — Progress Notes (Signed)
I reviewed the patient's PET scan from today. The liver lesion appears hypermetabolic. No other areas of suspicious activity with regards to distant disease. I have ordered a biopsy of this lesion.

## 2013-04-16 ENCOUNTER — Encounter (HOSPITAL_COMMUNITY): Payer: Self-pay | Admitting: Pharmacy Technician

## 2013-04-16 ENCOUNTER — Encounter: Payer: Self-pay | Admitting: Oncology

## 2013-04-16 ENCOUNTER — Telehealth: Payer: Self-pay | Admitting: Oncology

## 2013-04-16 ENCOUNTER — Ambulatory Visit (HOSPITAL_BASED_OUTPATIENT_CLINIC_OR_DEPARTMENT_OTHER): Payer: BC Managed Care – PPO | Admitting: Oncology

## 2013-04-16 ENCOUNTER — Ambulatory Visit: Payer: BC Managed Care – PPO

## 2013-04-16 ENCOUNTER — Ambulatory Visit: Payer: BC Managed Care – PPO | Admitting: Nutrition

## 2013-04-16 VITALS — BP 170/84 | HR 68 | Temp 97.8°F | Resp 18 | Ht 68.0 in | Wt 145.0 lb

## 2013-04-16 DIAGNOSIS — C20 Malignant neoplasm of rectum: Secondary | ICD-10-CM

## 2013-04-16 DIAGNOSIS — G893 Neoplasm related pain (acute) (chronic): Secondary | ICD-10-CM

## 2013-04-16 DIAGNOSIS — R599 Enlarged lymph nodes, unspecified: Secondary | ICD-10-CM

## 2013-04-16 DIAGNOSIS — K7689 Other specified diseases of liver: Secondary | ICD-10-CM

## 2013-04-16 NOTE — Telephone Encounter (Signed)
gv pt appt schedule for October and November. Pt could not come for f/u 11/3 and was scheduled for 11/4.

## 2013-04-16 NOTE — Progress Notes (Signed)
This is a 62 year old male diagnosed with rectal mass.  He is a patient of Dr. Truett Perna.  Past medical history includes hypertension.  Patient denies history of diabetes.  Medications were reviewed.  Labs were reviewed.  Height: 68 inches. Weight: 145 pounds on October 21. Usual body weight: 160-165 pounds. BMI: 22.05.  Patient reports weight loss but denies nutritional side effects at this time.  He has been eating a high fiber diet.  Nutrition diagnosis: Food and nutrition related knowledge deficit related to diagnosis of rectal mass and associated treatments as evidenced by no prior need for nutrition related information.  Intervention: Patient was educated on diet for avoiding bowel obstruction.  He was encouraged to follow a low fiber diet with increased fluids.  I have asked him to try to eat small meals 5-6 times daily.  Patient was provided with fact sheet.  Questions were answered.  Teach back method used.  Contact information was given.  Monitoring, evaluation, goals: Patient will tolerate small, frequent meals eating a low fiber diet to avoid bowel obstruction and achieve weight maintenance.  Next visit: I will follow patient as needed throughout treatment.

## 2013-04-16 NOTE — Progress Notes (Signed)
Met with Talbert Cage. Explained role of nurse navigator. Educational information provided on colorectal cancer.  Referral made to dietician for diet education and he was seen today. [facility] resources provided to patient, including SW service information.   Patient lives alone and stated he had no family members or support persons living in the area.  SW referral made and A. Elmore, who will see and assess pt at upcoming visit.  Contact names and phone numbers were provided for entire [facility] team.  Teach back method was used.  Will continue to follow as needed.

## 2013-04-16 NOTE — Progress Notes (Signed)
Checked in new pt with no financial concerns. °

## 2013-04-16 NOTE — Progress Notes (Signed)
[facility] New Patient Consult   Referring MD: Yerik Zeringue 62 y.o.  04-14-1951    Reason for Referral: Rectal cancer     HPI: He reports a one-year history of rectal pain. He was evaluated for a prostate screening and was found to have a rectal mass. He was referred to gastroenterology and a partially obstructing mass was located at the anterior anus. He was taken to a colonoscopy by Dr. Christella Hartigan on 04/05/2013. A circumferential rectal stricture was noted from the anus up to 8 cm. The lumen could only be traversed with a gastroscope. The stricture was biopsied. The remaining colon mucosa showed melanosis but was otherwise normal. The pathology ([id number]) was positive for adenocarcinoma. He was referred for CTs of the chest, abdomen, and pelvis on 04/05/2013. A 2.7 cm irregular low-attenuation lesion is noted in the right hepatic lobe. Mild dilatation of the pancreatic duct. 1.2 cm left adrenal nodule. No peritoneal lymphadenopathy the enlarged prostate. Irregular wall thickening of the rectum with  1.1 cm perirectal lymph nodes. 2 cm low attenuation collection with a few foci of gas at the right lateral aspect of the rectum.  He was referred to Dr. Mitzi Hansen to consider neoadjuvant radiation. A staging PET scan on 04/12/2013 confirmed a hypermetabolic 2.2 cm mass in the liver. A long segment of circumferential abnormal wall thickening was noted in the rectum. No gas was felt to reflect ulceration causing localized extraluminal gas and appear contained. A mildly hypermetabolic right. Lymph node measured 0.9 x 1.1 cm.    Past Medical History  Diagnosis Date  . Hemorrhoids   . ED (erectile dysfunction)   . Hypertension   . Cancer 04/05/13    rectal  . DM (diabetes mellitus)     Past surgical history: None  Family History  Problem Relation Age of Onset  . Hypertension Father     Current outpatient prescriptions:HYDROcodone-acetaminophen  (NORCO/VICODIN) 5-325 MG per tablet, Take 2 tablets by mouth every 6 (six) hours as needed for pain., Disp: , Rfl:   Allergies: No Known Allergies  Social History: He is a Theatre stage manager at [employer], he [place] he does not use tobacco or alcohol. He is widowed. No transfusion history. No risk factor for HIV or hepatitis.  ROS:   Positives include: 15 pound weight loss over the past year, rectal bleeding, constipation, rectal pain  A complete ROS was otherwise negative.  Physical Exam:  Blood pressure 170/84, pulse 68, temperature 97.8 F (36.6 C), temperature source Oral, resp. rate 18, height 5\' 8"  (1.727 m), weight 145 lb (65.772 kg).  HEENT: Multiple missing teeth, oropharynx without visible mass, neck without mass Lungs: Clear bilaterally Cardiac: Regular rate and rhythm Abdomen: No hepatomegaly, nontender, no mass GU: Testes without mass  Vascular: No leg edema Lymph nodes: No cervical, supraclavicular, axillar, or inguinal nodes Neurologic: Alert and oriented, the motor exam appears intact in the upper and lower extremities Skin: No rash Musculoskeletal: No spine tenderness  Rectal: There is a firm anterior mass beginning just proximal to the anal verge, 1 cm or less. The mass is obstructing and could not be passed with the examination finger.   LAB:  CBC  Lab Results  Component Value Date   WBC 10.1 04/05/2013   HGB 11.1* 04/05/2013   HCT 33.5* 04/05/2013   MCV 82.8 04/05/2013   PLT 419.0* 04/05/2013   ANC 7.5  CMP      Component Value Date/Time   NA  144 04/05/2013 0937   K 3.6 04/05/2013 0937   CL 111 04/05/2013 0937   CO2 24 04/05/2013 0937   GLUCOSE 87 04/05/2013 0937   BUN 17 04/05/2013 0937   CREATININE 0.8 04/05/2013 0937   CALCIUM 8.8 04/05/2013 0937   PROT 6.9 04/05/2013 0937   ALBUMIN 3.8 04/05/2013 0937   AST 20 04/05/2013 0937   ALT 20 04/05/2013 0937   ALKPHOS 61 04/05/2013 0937   BILITOT 0.5 04/05/2013 0937   CEA  0.9  Radiology: As per history of present illness, I reviewed the PET scan from 04/12/2013 and the CT from 04/05/2013 with Mr. Cossin    Assessment/Plan:   1. Rectal cancer, clinical stage IV, presenting with a rectal mass, perirectal lymphadenopathy, an and a hypermetabolic liver lesion  2. Pain/constipation secondary to #1  3. History of hypertension   Disposition:   I discussed the diagnosis of rectal cancer and reviewed the staging studies with Mr. Frankowski. He understands the liver lesion likely represents a metastasis. He has been referred for a biopsy of this lesion.  His case will be presented at the GI tumor conference on 04/17/2013. My initial impression is to recommend concurrent chemotherapy/radiation for treatment of the primary rectal tumor and palliation of his current symptoms. We can consider systemic and local options for treatment of the liver lesion at the completion of neoadjuvant therapy. We will make a surgical referral as indicated based on the GI tumor conference discussion.  I recommend concurrent capecitabine and radiation. We reviewed the potential toxicities associated with capecitabine including the chance for mucositis, diarrhea, and hematologic toxicity. We discussed the rash, hyperpigmentation, and hand/foot syndrome associated with capecitabine. He will attend a chemotherapy teaching class.  I anticipate capecitabine and radiation starting on 04/29/2013. He will return for an office visit and nadir CBC on 05/09/2013.  We recommended he begin a stool softener.  Approximately 50 minutes were spent with patient today. The majority of the time was used for counseling and coordination of care.  Gwynevere Lizana 04/16/2013, 5:19 PM

## 2013-04-17 ENCOUNTER — Encounter (HOSPITAL_COMMUNITY): Admission: RE | Admit: 2013-04-17 | Payer: BC Managed Care – PPO | Source: Ambulatory Visit

## 2013-04-17 ENCOUNTER — Telehealth: Payer: Self-pay | Admitting: *Deleted

## 2013-04-17 ENCOUNTER — Other Ambulatory Visit: Payer: Self-pay | Admitting: Radiology

## 2013-04-17 MED ORDER — DOCUSATE SODIUM 100 MG PO CAPS: 100.0000 mg | ORAL_CAPSULE | Freq: Two times a day (BID) | ORAL | Status: DC

## 2013-04-17 NOTE — Telephone Encounter (Signed)
CALLED PATIENT TO ASK ABOUT COMING IN FOR SIM, LVM FOR A RETURN CALL

## 2013-04-18 ENCOUNTER — Ambulatory Visit (HOSPITAL_COMMUNITY)
Admission: RE | Admit: 2013-04-18 | Discharge: 2013-04-18 | Disposition: A | Discharge status: Home / Self Care | Payer: BC Managed Care – PPO | Source: Ambulatory Visit | Attending: Radiation Oncology | Admitting: Radiation Oncology

## 2013-04-18 ENCOUNTER — Encounter (HOSPITAL_COMMUNITY): Payer: Self-pay

## 2013-04-18 ENCOUNTER — Encounter: Payer: Self-pay | Admitting: Oncology

## 2013-04-18 ENCOUNTER — Other Ambulatory Visit: Payer: Self-pay | Admitting: *Deleted

## 2013-04-18 VITALS — BP 167/71 | HR 73 | Temp 97.7°F | Resp 16 | Ht 68.0 in | Wt 145.0 lb

## 2013-04-18 DIAGNOSIS — C20 Malignant neoplasm of rectum: Secondary | ICD-10-CM

## 2013-04-18 DIAGNOSIS — Z85048 Personal history of other malignant neoplasm of rectum, rectosigmoid junction, and anus: Secondary | ICD-10-CM | POA: Insufficient documentation

## 2013-04-18 DIAGNOSIS — C787 Secondary malignant neoplasm of liver and intrahepatic bile duct: Secondary | ICD-10-CM | POA: Insufficient documentation

## 2013-04-18 DIAGNOSIS — E119 Type 2 diabetes mellitus without complications: Secondary | ICD-10-CM | POA: Insufficient documentation

## 2013-04-18 DIAGNOSIS — I10 Essential (primary) hypertension: Secondary | ICD-10-CM | POA: Insufficient documentation

## 2013-04-18 LAB — CBC
HCT: 35.1 % — ABNORMAL LOW (ref 39.0–52.0)
Hemoglobin: 11.7 g/dL — ABNORMAL LOW (ref 13.0–17.0)
MCH: 27.4 pg (ref 26.0–34.0)
MCV: 82.2 fL (ref 78.0–100.0)
RBC: 4.27 MIL/uL (ref 4.22–5.81)
WBC: 7 10*3/uL (ref 4.0–10.5)

## 2013-04-18 LAB — APTT: aPTT: 31 seconds (ref 24–37)

## 2013-04-18 LAB — PROTIME-INR: Prothrombin Time: 13 seconds (ref 11.6–15.2)

## 2013-04-18 MED ORDER — MIDAZOLAM HCL 2 MG/2ML IJ SOLN
INTRAMUSCULAR | Status: AC
  Filled 2013-04-18: qty 6

## 2013-04-18 MED ORDER — FENTANYL CITRATE 0.05 MG/ML IJ SOLN
INTRAMUSCULAR | Status: AC | PRN
  Administered 2013-04-18 (×3): 50 ug via INTRAVENOUS

## 2013-04-18 MED ORDER — MIDAZOLAM HCL 2 MG/2ML IJ SOLN
INTRAMUSCULAR | Status: AC | PRN
  Administered 2013-04-18 (×3): 1 mg via INTRAVENOUS

## 2013-04-18 MED ORDER — SODIUM CHLORIDE 0.9 % IV SOLN
INTRAVENOUS | Status: DC
  Administered 2013-04-18: 12:00:00 via INTRAVENOUS

## 2013-04-18 MED ORDER — FENTANYL CITRATE 0.05 MG/ML IJ SOLN
INTRAMUSCULAR | Status: AC
  Filled 2013-04-18: qty 6

## 2013-04-18 MED ORDER — CAPECITABINE 500 MG PO TABS: 1500.0000 mg | ORAL_TABLET | Freq: Two times a day (BID) | ORAL | Status: DC

## 2013-04-18 NOTE — H&P (Signed)
Chief Complaint: "I'm here for a liver biopsy" Referring Physician:Moody HPI: Brendan Mosley is an 62 y.o. male with rectal cancer and is now found to have a liver lesion on CT/PET. He is referred for US guided biopsy. PMHx and meds reviewed. He underwent colonoscopy with bx last week and feels well from that. No recent fevers, chills, illness, abd pain.   Past Medical History:  Past Medical History  Diagnosis Date  . Hemorrhoids   . ED (erectile dysfunction)   . Hypertension   . Cancer 04/05/13    rectal  . DM (diabetes mellitus)     Past Surgical History: History reviewed. No pertinent past surgical history.  Family History:  Family History  Problem Relation Age of Onset  . Hypertension Father     Social History:  reports that he has never smoked. He has never used smokeless tobacco. He reports that he does not drink alcohol or use illicit drugs.  Allergies: No Known Allergies  Medications:   Medication List    ASK your doctor about these medications       capecitabine 500 MG tablet  Commonly known as:  XELODA  Take 3 tablets (1,500 mg total) by mouth 2 (two) times daily after a meal. Take on days of radiation only (Mon- Fri)     docusate sodium 100 MG capsule  Commonly known as:  COLACE  Take 1 capsule (100 mg total) by mouth 2 (two) times daily.     HYDROcodone-acetaminophen 5-325 MG per tablet  Commonly known as:  NORCO/VICODIN  Take 2 tablets by mouth every 6 (six) hours as needed for pain.        Please HPI for pertinent positives, otherwise complete 10 system ROS negative.  Physical Exam: BP 170/81  Temp(Src) 98.3 F (36.8 C)  Resp 18  Ht 5\' 8"  (1.727 m)  Wt 145 lb (65.772 kg)  BMI 22.05 kg/m2  SpO2 99% Body mass index is 22.05 kg/(m^2).   General Appearance:  Alert, cooperative, no distress, appears stated age  Head:  Normocephalic, without obvious abnormality, atraumatic  ENT: Unremarkable  Neck: Supple, symmetrical, trachea midline   Lungs:   Clear to auscultation bilaterally, no w/r/r,  Chest Wall:  No tenderness or deformity  Heart:  Regular rate and rhythm, S1, S2 normal, no murmur, rub or gallop.  Abdomen:   Soft, non-tender, non distended.  Neurologic: Normal affect, no gross deficits.   No results found for this or any previous visit (from the past 24 hour(s)).  Assessment/Plan: Rectal cancer with liver mass Discussed US guided liver lesion biopsy. Explained procedure, risks, complications, use of sedation. Labs pending Consent signed in chart  Brayton El PA-C 04/18/2013, 12:22 PM

## 2013-04-18 NOTE — Procedures (Signed)
Technically successful US guided biopsy of lesion within the right lobe of the liver.  No immediate complications.  

## 2013-04-18 NOTE — Progress Notes (Signed)
Pt having problems with ride home.  Pt states he can not wait til 4pm to go home as previously suggested by MD.  Pt states his ride needs to catch a plane out of town.  Dr.Watts states as long as pt is stable, VSS, abdomen dressing is okay pt may go at 3pm.  Pt is okay with this and states that will work good.

## 2013-04-18 NOTE — Telephone Encounter (Signed)
Message from [place] with [facility] reporting pt's co-pay is estimated at $25. They will contact pt to arrange shipment.

## 2013-04-18 NOTE — Progress Notes (Signed)
Faxed xeloda prescription to [facility] °

## 2013-04-19 NOTE — Telephone Encounter (Signed)
Message from [place] with [facility]. They have been unable to contact pt to arrange delivery. Left message on voicemail for pt to call pharmacy. Marisa Cyphers with phone # we have on file.

## 2013-04-22 NOTE — Addendum Note (Signed)
Encounter addended by: Lowella Petties, RN on: 04/22/2013  9:31 AM<BR>     Documentation filed: Charges VN

## 2013-04-23 ENCOUNTER — Encounter: Payer: Self-pay | Admitting: *Deleted

## 2013-04-23 ENCOUNTER — Other Ambulatory Visit: Payer: BC Managed Care – PPO

## 2013-04-23 ENCOUNTER — Encounter (HOSPITAL_COMMUNITY): Admission: RE | Admit: 2013-04-23 | Payer: PRIVATE HEALTH INSURANCE | Source: Ambulatory Visit

## 2013-04-25 ENCOUNTER — Ambulatory Visit
Admission: RE | Admit: 2013-04-25 | Discharge: 2013-04-25 | Disposition: A | Discharge status: Home / Self Care | Payer: BC Managed Care – PPO | Source: Ambulatory Visit | Attending: Radiation Oncology | Admitting: Radiation Oncology

## 2013-04-25 DIAGNOSIS — C20 Malignant neoplasm of rectum: Secondary | ICD-10-CM

## 2013-04-25 MED ORDER — HYDROCORTISONE ACETATE 1 % EX OINT: TOPICAL_OINTMENT | CUTANEOUS | Status: DC

## 2013-04-25 MED ORDER — OXYCODONE-ACETAMINOPHEN 7.5-325 MG PO TABS: 1.0000 | ORAL_TABLET | ORAL | Status: DC | PRN

## 2013-04-25 NOTE — Telephone Encounter (Signed)
RECEIVED A FAX FROM [facility] CONCERNING A CONFIRMATION OF PRESCRIPTION SHIPMENT FOR CAPECITABINE ON 04/23/13.

## 2013-04-26 NOTE — Progress Notes (Signed)
  Radiation Oncology         [phone]) [phone] ________________________________  Name: Brendan Mosley MRN: [id number]  Date: 04/25/2013  DOB: [birth year]  SIMULATION AND TREATMENT PLANNING NOTE  The patient presented for simulation for the patient's upcoming course of preoperative radiation for the diagnosis of rectal cancer. The patient was placed in a supine position. A customized alpha cradle was constructed toaid in patient immobilization. This complex treatment device will be used on a daily basis during the treatment. In this fashion a CT scan was obtained through the pelvic region and the isocenter was placed near midline within the pelvis.  The patient will initially be planned to receive a course of radiation to a dose of 45 gray. This will be accomplished in 25 fractions at 1.8 gray per fraction. This initial treatment will correspond to a 3-D conformal technique. The gross tumor volume has been contoured in addition to the rectum, bladder and femoral heads. DVH's of each of these structures have been requested and these will be carefully reviewed as part of the 3-D conformal treatment planning process. To accomplish this initial treatment, 4 customized blocks have been designed for this purpose. Each of these 4 complex treatment devices will be used on a daily basis during the initial course of his treatment. It is anticipated that the patient will then receive a boost for an additional 5.4 gray. The anticipated total dose therefore will be 50.4 gray.  Special treatment procedure The patient will receive chemotherapy during the course of radiation treatment. The patient may experience increased or overlapping toxicity due to this combined-modality approach and the patient will be monitored for such problems. This may include extra lab work as necessary. This therefore constitutes a special treatment procedure.    ________________________________  Radene Gunning, MD, PhD

## 2013-04-30 ENCOUNTER — Telehealth: Payer: Self-pay | Admitting: *Deleted

## 2013-04-30 ENCOUNTER — Ambulatory Visit (HOSPITAL_BASED_OUTPATIENT_CLINIC_OR_DEPARTMENT_OTHER): Payer: BC Managed Care – PPO | Admitting: Nurse Practitioner

## 2013-04-30 ENCOUNTER — Telehealth: Payer: Self-pay | Admitting: Oncology

## 2013-04-30 VITALS — BP 138/73 | HR 78 | Temp 97.2°F | Resp 20 | Ht 68.0 in | Wt 142.9 lb

## 2013-04-30 DIAGNOSIS — K5909 Other constipation: Secondary | ICD-10-CM

## 2013-04-30 DIAGNOSIS — C20 Malignant neoplasm of rectum: Secondary | ICD-10-CM

## 2013-04-30 DIAGNOSIS — G893 Neoplasm related pain (acute) (chronic): Secondary | ICD-10-CM

## 2013-04-30 NOTE — Telephone Encounter (Signed)
Left message at home number for pt to call office. (Office visit scheduled for today should have been scheduled for 11/13. No need for visit unless pt is having an issue.)

## 2013-04-30 NOTE — Telephone Encounter (Signed)
gv and printed appt sched and avs for ptf or Nov °

## 2013-04-30 NOTE — Progress Notes (Signed)
OFFICE PROGRESS NOTE  Interval history:  [name] is seen for followup of recently diagnosed rectal cancer. He reports a good appetite. Energy is "alright". He continues to have pain at the rectum. He takes Percocet as needed. Bowels overall moving regularly though he periodically notes some constipation. He continues to have rectal bleeding. No nausea or vomiting. No shortness of breath or cough. No skin rash.   Objective: Blood pressure 138/73, pulse 78, temperature 97.2 F (36.2 C), temperature source Oral, resp. rate 20, height 5\' 8"  (1.727 m), weight 142 lb 14.4 oz (64.819 kg).  Oropharynx is without thrush or ulceration. Lungs are clear. Regular cardiac rhythm. Abdomen soft and nontender. No hepatomegaly. Extremities without edema. Calves soft and nontender. Palms without erythema. No skin rash.  Lab Results: Lab Results  Component Value Date   WBC 7.0 04/18/2013   HGB 11.7* 04/18/2013   HCT 35.1* 04/18/2013   MCV 82.2 04/18/2013   PLT 442* 04/18/2013    Chemistry:    Chemistry      Component Value Date/Time   NA 144 04/05/2013 0937   K 3.6 04/05/2013 0937   CL 111 04/05/2013 0937   CO2 24 04/05/2013 0937   BUN 17 04/05/2013 0937   CREATININE 0.8 04/05/2013 0937      Component Value Date/Time   CALCIUM 8.8 04/05/2013 0937   ALKPHOS 61 04/05/2013 0937   AST 20 04/05/2013 0937   ALT 20 04/05/2013 0937   BILITOT 0.5 04/05/2013 0937       Studies/Results: Ct Chest W Contrast  04/05/2013   ADDENDUM REPORT: 04/05/2013 17:22  ADDENDUM: Addition to impression:  4. There is a 1.2 cm indeterminate left adrenal nodule.   Electronically Signed   By: Annia Belt M.D.   On: 04/05/2013 17:22   04/05/2013   CLINICAL DATA:  Rectal mass  EXAM: CT CHEST, ABDOMEN, AND PELVIS WITH CONTRAST  TECHNIQUE: Multidetector CT imaging of the chest, abdomen and pelvis was performed following the standard protocol during bolus administration of intravenous contrast.  CONTRAST:   OMNIPAQUE IOHEXOL 300 MG/ML  SOLN  COMPARISON:  None  FINDINGS: CT CHEST FINDINGS  Visualized neck base is unremarkable. No enlarged axillary, mediastinal or hilar lymphadenopathy. Normal heart size. No pericardial effusion.  Central airways are patent. No consolidative or nodular pulmonary opacities. No pleural effusion pneumothorax.  CT ABDOMEN AND PELVIS FINDINGS  Within the right hepatic lobe there is a 2.7 x 1.9 cm irregular low-attenuation lesion (image 59; series 2). Portal and hepatic veins are patent. Gallbladder is unremarkable. The spleen is unremarkable. There is mild dilatation of the pancreatic duct without definitive causing etiology identified. This measures up to 5 mm within the region of the pancreatic head and uncinate process. There is a 1.2 cm left adrenal nodule. Kidneys enhance symmetrically with contrast. No hydronephrosis.  Normal caliber abdominal aorta. No retroperitoneal lymphadenopathy. Urinary bladder is unremarkable. Prostate is enlarged measuring up to 4.5 cm.  There is marked irregular circumferential wall thickening of the rectum with a 2.2 x 1.4 cm mass emanating from the left lateral aspect of the rectum. There is a 1.1 cm enlarged perirectal lymph node (image 103; series 2) and more superiorly a 1.1 cm perirectal lymph node (image 98; series 2). Normal appendix. Stool within the cecum and ascending colon. No evidence for bowel obstruction. No free intraperitoneal air. Additionally along the right lateral aspect of the rectum there is a 2 cm low attenuation collection with all a few foci of gas (  image 120; series 2).  Lower lumbar spine degenerative change. Bilateral hip joint degenerative change.  IMPRESSION: 1. Marked irregular circumferential wall thickening of the rectum with an associated mass measuring up to 2.2 cm concerning for primary rectal carcinoma. There are multiple enlarged perirectal lymph nodes concerning for local regional metastatic disease. Additionally there  is an regular low-attenuation lesion within the anterior right hepatic lobe concerning for hepatic metastatic disease. 2. Small 2 cm fluid collection with a few foci of gas adjacent to the distal rectum/ anus may be sequelae of necrotic tumor versus small perirectal abscess. 3. There is mild dilation of the pancreatic duct throughout the pancreatic parenchyma measuring up to 5 mm within the region of the head and uncinate process. No causative etiology is identified. Recommend correlation with laboratory analysis and possible MRI/ MRCP as clinically indicated. These results will be called to the ordering clinician or representative by the Radiologist Assistant, and communication documented in the PACS Dashboard.  Electronically Signed: By: Annia Belt M.D. On: 04/05/2013 16:15   Ct Abdomen Pelvis W Contrast  04/05/2013   ADDENDUM REPORT: 04/05/2013 17:22  ADDENDUM: Addition to impression:  4. There is a 1.2 cm indeterminate left adrenal nodule.   Electronically Signed   By: Annia Belt M.D.   On: 04/05/2013 17:22   04/05/2013   CLINICAL DATA:  Rectal mass  EXAM: CT CHEST, ABDOMEN, AND PELVIS WITH CONTRAST  TECHNIQUE: Multidetector CT imaging of the chest, abdomen and pelvis was performed following the standard protocol during bolus administration of intravenous contrast.  CONTRAST:  OMNIPAQUE IOHEXOL 300 MG/ML  SOLN  COMPARISON:  None  FINDINGS: CT CHEST FINDINGS  Visualized neck base is unremarkable. No enlarged axillary, mediastinal or hilar lymphadenopathy. Normal heart size. No pericardial effusion.  Central airways are patent. No consolidative or nodular pulmonary opacities. No pleural effusion pneumothorax.  CT ABDOMEN AND PELVIS FINDINGS  Within the right hepatic lobe there is a 2.7 x 1.9 cm irregular low-attenuation lesion (image 59; series 2). Portal and hepatic veins are patent. Gallbladder is unremarkable. The spleen is unremarkable. There is mild dilatation of the pancreatic duct without  definitive causing etiology identified. This measures up to 5 mm within the region of the pancreatic head and uncinate process. There is a 1.2 cm left adrenal nodule. Kidneys enhance symmetrically with contrast. No hydronephrosis.  Normal caliber abdominal aorta. No retroperitoneal lymphadenopathy. Urinary bladder is unremarkable. Prostate is enlarged measuring up to 4.5 cm.  There is marked irregular circumferential wall thickening of the rectum with a 2.2 x 1.4 cm mass emanating from the left lateral aspect of the rectum. There is a 1.1 cm enlarged perirectal lymph node (image 103; series 2) and more superiorly a 1.1 cm perirectal lymph node (image 98; series 2). Normal appendix. Stool within the cecum and ascending colon. No evidence for bowel obstruction. No free intraperitoneal air. Additionally along the right lateral aspect of the rectum there is a 2 cm low attenuation collection with all a few foci of gas (image 120; series 2).  Lower lumbar spine degenerative change. Bilateral hip joint degenerative change.  IMPRESSION: 1. Marked irregular circumferential wall thickening of the rectum with an associated mass measuring up to 2.2 cm concerning for primary rectal carcinoma. There are multiple enlarged perirectal lymph nodes concerning for local regional metastatic disease. Additionally there is an regular low-attenuation lesion within the anterior right hepatic lobe concerning for hepatic metastatic disease. 2. Small 2 cm fluid collection with a few foci of  gas adjacent to the distal rectum/ anus may be sequelae of necrotic tumor versus small perirectal abscess. 3. There is mild dilation of the pancreatic duct throughout the pancreatic parenchyma measuring up to 5 mm within the region of the head and uncinate process. No causative etiology is identified. Recommend correlation with laboratory analysis and possible MRI/ MRCP as clinically indicated. These results will be called to the ordering clinician or  representative by the Radiologist Assistant, and communication documented in the PACS Dashboard.  Electronically Signed: By: Annia Belt M.D. On: 04/05/2013 16:15   Nm Pet Image Initial (pi) Skull Base To Thigh  04/12/2013   CLINICAL DATA:  Staging initial treatment strategy for rectal cancer.  EXAM: NUCLEAR MEDICINE PET SKULL BASE TO THIGH  FASTING BLOOD GLUCOSE:  Value:  102 mg/dl  TECHNIQUE: 29.5 mCi A-21 FDG was injected intravenously. CT data was obtained and used for attenuation correction and anatomic localization only. (This was not acquired as a diagnostic CT examination.) Additional exam technical data entered on technologist worksheet.  COMPARISON:  04/05/2013  FINDINGS: NECK  No hypermetabolic lymph nodes in the neck. Chronic right sphenoid and chronic right maxillary sinusitis.  CHEST  No hypermetabolic mediastinal or hilar nodes. No suspicious pulmonary nodules on the CT scan. Degenerative left glenohumeral arthropathy noted.  ABDOMEN/PELVIS  2.2 cm mass at the junction of segment 5 and segment 8 of the liver, maximum standard uptake value 10.4 compatible with malignancy.  Long segment of circumferential abnormal wall thickening in the rectum, maximum standard uptake value 21.0, compatible with malignancy. Perianal gas on the right on image 231 likely reflects ulceration causing localized extraluminal gas but appears contained by the external sphincter. A nodular lesion in the left perirectal space has surprisingly low uptake with maximum SUV of 1.6, and may represent a complex fluid collection rather than necessarily being 8 perirectal metastatic node. However, a smaller 0.9 x 1.1 cm right posterior perirectal node on image 200 native series 2 is mildly hypermetabolic with maximum standard uptake value of 4, and multiple additional small perirectal nodes are present along with mild perirectal stranding.  SKELETON  No focal hypermetabolic activity to suggest skeletal metastasis.  IMPRESSION: 1.  Large and highly hypermetabolic rectal mass with perirectal adenopathy. Extraluminal right perianal gas likely due to ulceration, less likely biopsy. 2. Hypermetabolic right hepatic lobe mass at the junction of segments 5 and 8 of the liver, most compatible with metastatic lesion. 3. Chronic right paranasal sinusitis.   Electronically Signed   By: Herbie Baltimore M.D.   On: 04/12/2013 11:05   US Biopsy  04/18/2013   INDICATION: History of rectal carcinoma, now with hypermetabolic liver lesion worrisome for metastatic disease  EXAM: ULTRASOUND GUIDED LIVER LESION BIOPSY  MEDICATIONS: Fentanyl IV; Versed 2 mg IV  ANESTHESIA/SEDATION: Total Moderate Sedation time  14 minutes  COMPARISON:  PET-CT- 04/12/2013; CT abdomen and pelvis - 04/05/2013  PROCEDURE: Informed written consent was obtained from the patient after a discussion of the risks, benefits and alternatives to treatment. The patient understands and consents the procedure. A timeout was performed prior to the initiation of the procedure.  Ultrasound scanning was performed of the right upper abdominal quadrant demonstrates a 2.8 x 2.1 cm mixed echogenic solid lesion within the subcapsular aspect of the right lobe of the liver which correlates with the hypermetabolic liver lesions seen on preceding PET-CT.  The procedure was planned. The right upper abdominal quadrant was prepped and draped in the usual sterile fashion. The overlying  soft tissues were anesthetized with 1% lidocaine with epinephrine. A 17 gauge, 6.8 cm co-axial needle was advanced into a peripheral aspect of the lesion. This was followed by 3 core biopsies with an 18 gauge core device under direct ultrasound guidance. Multiple ultrasound images were obtained for documentation purposes.  The co-axial needle was removed and hemostasis was obtained with manual compression. Post procedural scanning was negative for definitive area of hemorrhage or additional complication. A dressing was  placed. The patient tolerated the procedure well without immediate post procedural complication.  COMPLICATIONS: None immediate  IMPRESSION: Technically successful ultrasound guided core needle biopsy of lesion within the subcapsular aspect of the right lobe of the liver.   Electronically Signed   By: Simonne Come M.D.   On: 04/18/2013 16:10    Medications: I have reviewed the patient's current medications.  Assessment/Plan:  1. Rectal cancer, clinical stage IV, presenting with a rectal mass, perirectal lymphadenopathy and a hypermetabolic liver lesion. 2. Pain/constipation/rectal bleeding secondary to #1. 3. History of hypertension.  Disposition-[name] is scheduled to begin the course of radiation on 05/06/2013. He understands to begin Xeloda that day as well. He understands to take the Xeloda on days of radiation only.  We again reviewed potential toxicities associated with Xeloda including mouth sores, nausea, diarrhea, hand-foot syndrome, skin hyperpigmentation, skin rash, conjunctivitis.  We will see him in followup on 05/16/2013. He will contact the office prior to that visit with any problems.  Plan reviewed with Dr. Truett Perna.  Lonna Cobb ANP/GNP-BC

## 2013-05-02 ENCOUNTER — Ambulatory Visit
Admission: RE | Admit: 2013-05-02 | Discharge: 2013-05-02 | Disposition: A | Discharge status: Home / Self Care | Payer: BC Managed Care – PPO | Source: Ambulatory Visit | Attending: Radiation Oncology | Admitting: Radiation Oncology

## 2013-05-02 DIAGNOSIS — C20 Malignant neoplasm of rectum: Secondary | ICD-10-CM

## 2013-05-02 NOTE — Progress Notes (Signed)
  Radiation Oncology         [phone]) [phone] ________________________________  Name: Brendan Mosley MRN: [id number]  Date: 05/02/2013  DOB: 07/27/1950  Simulation Verification Note   NARRATIVE: The patient was brought to the treatment unit and placed in the planned treatment position. The clinical setup was verified. Then port films were obtained and uploaded to the radiation oncology medical record software.  The treatment beams were carefully compared against the planned radiation fields. The position, location, and shape of the radiation fields was reviewed. The targeted volume of tissue appears to be appropriately covered by the radiation beams. Based on my personal review, I approved the simulation verification. The patient's treatment will proceed as planned.  ________________________________   Radene Gunning, MD, PhD

## 2013-05-06 ENCOUNTER — Ambulatory Visit
Admission: RE | Admit: 2013-05-06 | Discharge: 2013-05-06 | Disposition: A | Discharge status: Home / Self Care | Payer: BC Managed Care – PPO | Source: Ambulatory Visit | Attending: Radiation Oncology | Admitting: Radiation Oncology

## 2013-05-06 DIAGNOSIS — C20 Malignant neoplasm of rectum: Secondary | ICD-10-CM

## 2013-05-06 NOTE — Progress Notes (Signed)
pateint education done,   Radiation therapy and you book, sitz bath given with instructions of use of sitz bath, patient needs refill  On cream and percocet will see MD Wednesday after tx teach back given

## 2013-05-07 ENCOUNTER — Ambulatory Visit
Admission: RE | Admit: 2013-05-07 | Discharge: 2013-05-07 | Disposition: A | Discharge status: Home / Self Care | Payer: BC Managed Care – PPO | Source: Ambulatory Visit | Attending: Radiation Oncology | Admitting: Radiation Oncology

## 2013-05-08 ENCOUNTER — Ambulatory Visit
Admission: RE | Admit: 2013-05-08 | Discharge: 2013-05-08 | Disposition: A | Discharge status: Home / Self Care | Payer: BC Managed Care – PPO | Source: Ambulatory Visit | Attending: Radiation Oncology | Admitting: Radiation Oncology

## 2013-05-08 ENCOUNTER — Other Ambulatory Visit: Payer: Self-pay | Admitting: Radiation Oncology

## 2013-05-08 MED ORDER — OXYCODONE-ACETAMINOPHEN 7.5-325 MG PO TABS: 1.0000 | ORAL_TABLET | ORAL | Status: DC | PRN

## 2013-05-08 MED ORDER — HYDROCORTISONE ACETATE 1 % EX OINT: TOPICAL_OINTMENT | CUTANEOUS | Status: DC

## 2013-05-09 ENCOUNTER — Telehealth: Payer: Self-pay | Admitting: *Deleted

## 2013-05-09 ENCOUNTER — Ambulatory Visit
Admission: RE | Admit: 2013-05-09 | Discharge: 2013-05-09 | Disposition: A | Discharge status: Home / Self Care | Payer: BC Managed Care – PPO | Source: Ambulatory Visit | Attending: Radiation Oncology | Admitting: Radiation Oncology

## 2013-05-09 NOTE — Telephone Encounter (Signed)
THIS FAX WAS GIVEN TO DR.SHERRILL'S NURSE, SUSAN COWARD,RN.

## 2013-05-10 ENCOUNTER — Encounter: Payer: Self-pay | Admitting: Radiation Oncology

## 2013-05-10 ENCOUNTER — Ambulatory Visit
Admission: RE | Admit: 2013-05-10 | Discharge: 2013-05-10 | Disposition: A | Discharge status: Home / Self Care | Payer: BC Managed Care – PPO | Source: Ambulatory Visit | Attending: Radiation Oncology | Admitting: Radiation Oncology

## 2013-05-10 VITALS — BP 145/69 | HR 67 | Temp 98.4°F | Resp 20 | Wt 140.6 lb

## 2013-05-10 DIAGNOSIS — C20 Malignant neoplasm of rectum: Secondary | ICD-10-CM

## 2013-05-10 MED ORDER — OXYCODONE HCL ER 15 MG PO T12A: 15.0000 mg | EXTENDED_RELEASE_TABLET | Freq: Two times a day (BID) | ORAL | Status: DC

## 2013-05-10 NOTE — Progress Notes (Signed)
Patient needs help with social workers, will e-mail them to call him or see him Monday,gave graham crackers,pnut butter and coke 7:25 PM

## 2013-05-10 NOTE — Progress Notes (Signed)
weekly rad txs, rectum, 5/25 completed, gave patient rx's for hydrocortisone acetate cream and percocet, pain moderate," says size of an a[pple on side of my anus"  fatigue some, says poor appetite, says he hasn't had anything to eat today no money, lives on social securityu, had regular bowel movement today 7:21 PM

## 2013-05-11 NOTE — Progress Notes (Signed)
   Department of Radiation Oncology  Phone:  [phone] Fax:        [phone]  Weekly Treatment Note    Name: Brendan Mosley Date: 05/11/2013 MRN: [id number] DOB: 1950-12-30   Current dose: 9 Gy  Current fraction: 5   MEDICATIONS: Current Outpatient Prescriptions  Medication Sig Dispense Refill  . capecitabine (XELODA) 500 MG tablet Take 3 tablets (1,500 mg total) by mouth 2 (two) times daily after a meal. Take on days of radiation only (Mon- Fri)  168 tablet  0  . docusate sodium (COLACE) 100 MG capsule Take 1 capsule (100 mg total) by mouth 2 (two) times daily.  60 capsule  0  . Hydrocortisone Acetate 1 % OINT Apply to affected area prn.  30 g  1  . oxyCODONE-acetaminophen (PERCOCET) 7.5-325 MG per tablet Take 1-2 tablets by mouth every 4 (four) hours as needed for pain. Do not take more than 10 tablets per day.  60 tablet  0  . OxyCODONE (OXYCONTIN) 15 mg T12A 12 hr tablet Take 1 tablet (15 mg total) by mouth every 12 (twelve) hours.  60 tablet  0   No current facility-administered medications for this encounter.     ALLERGIES: Review of patient's allergies indicates no known allergies.   LABORATORY DATA:  Lab Results  Component Value Date   WBC 7.0 04/18/2013   HGB 11.7* 04/18/2013   HCT 35.1* 04/18/2013   MCV 82.2 04/18/2013   PLT 442* 04/18/2013   Lab Results  Component Value Date   NA 144 04/05/2013   K 3.6 04/05/2013   CL 111 04/05/2013   CO2 24 04/05/2013   Lab Results  Component Value Date   ALT 20 04/05/2013   AST 20 04/05/2013   ALKPHOS 61 04/05/2013   BILITOT 0.5 04/05/2013     NARRATIVE: Blong Busk was seen today for weekly treatment management. The chart was checked and the patient's films were reviewed. The patient states that he is having some increased pain in the rectal region. This has been present despite taking Percocet on a regular basis daily.  PHYSICAL EXAMINATION: weight is 140 lb 9.6 oz (63.776 kg). His oral temperature  is 98.4 F (36.9 C). His blood pressure is 145/69 and his pulse is 67. His respiration is 20.        ASSESSMENT: The patient is doing satisfactorily with treatment.  PLAN: We will continue with the patient's radiation treatment as planned. The patient has been given a prescription for a long-acting pain medicine.

## 2013-05-13 ENCOUNTER — Ambulatory Visit
Admission: RE | Admit: 2013-05-13 | Discharge: 2013-05-13 | Disposition: A | Discharge status: Home / Self Care | Payer: BC Managed Care – PPO | Source: Ambulatory Visit | Attending: Radiation Oncology | Admitting: Radiation Oncology

## 2013-05-14 ENCOUNTER — Ambulatory Visit
Admission: RE | Admit: 2013-05-14 | Discharge: 2013-05-14 | Disposition: A | Discharge status: Home / Self Care | Payer: BC Managed Care – PPO | Source: Ambulatory Visit | Attending: Radiation Oncology | Admitting: Radiation Oncology

## 2013-05-14 ENCOUNTER — Telehealth: Payer: Self-pay | Admitting: *Deleted

## 2013-05-14 NOTE — Telephone Encounter (Signed)
Message from [facility] reporting pt asked them to "hold off" on delivery of remaining Xeloda Rx. Stated he was having a lot of pain. Will speak with pt while in office for radiation.

## 2013-05-14 NOTE — Progress Notes (Signed)
Patient came to nursing, asked to speak with nutritionist, needs monies for food, let him know, I have already contacted social worker, and Cammy Copa was notified by Kathrin Penner, I gave him Abigail's phone number ,I tried calling, but, no answer on her phone, he will call later and leave her a message for help, I also asked him how he was taking  His Xeloda, he showed me he was taking 1500mg  after breakfast and after dinner, he wuill call Tanya,RN and leave her a message to that affect, I printed out his med list on Xeloda and gave it to him, gave patient 2 applesauces  Containers, graham crackers x 4 packets and spruite, with 4 containers of p-nut butter, to help him, he says he no money and that the applesauce graham crackers with opnut butter helped him after he took the Xeloda, no other concerns voiced,caled Tanya,rn [phone], and informed her patient stated he was taking Xeloda correctly 2:44 PM  2:43 PM

## 2013-05-14 NOTE — Telephone Encounter (Signed)
Pt returned call, he reports he is taking Xeloda as prescribed. Tolerating it well so far. Denies any hand/ foot pain. He received 84 tablets (half prescription) with the initial delivery of Xeloda. Instructed him to contact pharmacy as this is only a 2 week supply. He voiced understanding. Clydie Braun from [facility] made aware.

## 2013-05-15 ENCOUNTER — Ambulatory Visit
Admission: RE | Admit: 2013-05-15 | Discharge: 2013-05-15 | Disposition: A | Discharge status: Home / Self Care | Payer: BC Managed Care – PPO | Source: Ambulatory Visit | Attending: Radiation Oncology | Admitting: Radiation Oncology

## 2013-05-16 ENCOUNTER — Telehealth: Payer: Self-pay | Admitting: Oncology

## 2013-05-16 ENCOUNTER — Ambulatory Visit (HOSPITAL_BASED_OUTPATIENT_CLINIC_OR_DEPARTMENT_OTHER): Payer: BC Managed Care – PPO | Admitting: Nurse Practitioner

## 2013-05-16 ENCOUNTER — Other Ambulatory Visit (HOSPITAL_BASED_OUTPATIENT_CLINIC_OR_DEPARTMENT_OTHER): Payer: BC Managed Care – PPO | Admitting: Lab

## 2013-05-16 ENCOUNTER — Encounter: Payer: Self-pay | Admitting: *Deleted

## 2013-05-16 ENCOUNTER — Ambulatory Visit
Admission: RE | Admit: 2013-05-16 | Discharge: 2013-05-16 | Disposition: A | Discharge status: Home / Self Care | Payer: BC Managed Care – PPO | Source: Ambulatory Visit | Attending: Radiation Oncology | Admitting: Radiation Oncology

## 2013-05-16 VITALS — BP 119/66 | HR 65 | Temp 98.2°F

## 2013-05-16 DIAGNOSIS — D649 Anemia, unspecified: Secondary | ICD-10-CM

## 2013-05-16 DIAGNOSIS — G893 Neoplasm related pain (acute) (chronic): Secondary | ICD-10-CM

## 2013-05-16 DIAGNOSIS — I10 Essential (primary) hypertension: Secondary | ICD-10-CM

## 2013-05-16 DIAGNOSIS — K7689 Other specified diseases of liver: Secondary | ICD-10-CM

## 2013-05-16 DIAGNOSIS — C20 Malignant neoplasm of rectum: Secondary | ICD-10-CM

## 2013-05-16 LAB — CBC WITH DIFFERENTIAL/PLATELET
BASO%: 0.5 % (ref 0.0–2.0)
EOS%: 12.8 % — ABNORMAL HIGH (ref 0.0–7.0)
Eosinophils Absolute: 0.6 10*3/uL — ABNORMAL HIGH (ref 0.0–0.5)
HGB: 9.8 g/dL — ABNORMAL LOW (ref 13.0–17.1)
MCH: 26.8 pg — ABNORMAL LOW (ref 27.2–33.4)
MCHC: 32.4 g/dL (ref 32.0–36.0)
MCV: 82.8 fL (ref 79.3–98.0)
MONO%: 9.4 % (ref 0.0–14.0)
NEUT%: 61 % (ref 39.0–75.0)
Platelets: 438 10*3/uL — ABNORMAL HIGH (ref 140–400)
RBC: 3.65 10*6/uL — ABNORMAL LOW (ref 4.20–5.82)
RDW: 15 % — ABNORMAL HIGH (ref 11.0–14.6)
WBC: 5 10*3/uL (ref 4.0–10.3)

## 2013-05-16 LAB — COMPREHENSIVE METABOLIC PANEL (CC13)
AST: 16 U/L (ref 5–34)
Albumin: 3.3 g/dL — ABNORMAL LOW (ref 3.5–5.0)
Alkaline Phosphatase: 63 U/L (ref 40–150)
BUN: 17.6 mg/dL (ref 7.0–26.0)
Creatinine: 0.8 mg/dL (ref 0.7–1.3)
Glucose: 97 mg/dl (ref 70–140)
Potassium: 4.1 mEq/L (ref 3.5–5.1)
Sodium: 140 mEq/L (ref 136–145)
Total Bilirubin: 0.2 mg/dL (ref 0.20–1.20)
Total Protein: 6.6 g/dL (ref 6.4–8.3)

## 2013-05-16 NOTE — Telephone Encounter (Signed)
gv and printed appt scheda nd avs for pt for NOV adn DEC °

## 2013-05-16 NOTE — Patient Instructions (Signed)
° °  Iron-Rich Diet ° °An iron-rich diet contains foods that are good sources of iron. Iron is an important mineral that helps your body produce hemoglobin. Hemoglobin is a protein in red blood cells that carries oxygen to the body's tissues. Sometimes, the iron level in your blood can be low. This may be caused by: °· A lack of iron in your diet. °· Blood loss. °· Times of growth, such as during pregnancy or during a child's growth and development. °Low levels of iron can cause a decrease in the number of red blood cells. This can result in iron deficiency anemia. Iron deficiency anemia symptoms include: °· Tiredness. °· Weakness. °· Irritability. °· Increased chance of infection. °Here are some recommendations for daily iron intake: °· Males older than 64 years of age need 8 mg of iron per day. °· Women ages 19 to 50 need 18 mg of iron per day. °· Pregnant women need 27 mg of iron per day, and women who are over 19 years of age and breastfeeding need 9 mg of iron per day. °· Women over the age of 50 need 8 mg of iron per day. °SOURCES OF IRON °There are 2 types of iron that are found in food: heme iron and nonheme iron. Heme iron is absorbed by the body better than nonheme iron. Heme iron is found in meat, poultry, and fish. Nonheme iron is found in grains, beans, and vegetables. °Heme Iron Sources °Food / Iron (mg) °· Chicken liver, 3 oz (85 g)/ 10 mg °· Beef liver, 3 oz (85 g)/ 5.5 mg °· Oysters, 3 oz (85 g)/ 8 mg °· Beef, 3 oz (85 g)/ 2 to 3 mg °· Shrimp, 3 oz (85 g)/ 2.8 mg °· Turkey, 3 oz (85 g)/ 2 mg °· Chicken, 3 oz (85 g) / 1 mg °· Fish (tuna, halibut), 3 oz (85 g)/ 1 mg °· Pork, 3 oz (85 g)/ 0.9 mg °Nonheme Iron Sources °Food / Iron (mg) °· Ready-to-eat breakfast cereal, iron-fortified / 3.9 to 7 mg °· Tofu, ½ cup / 3.4 mg °· Kidney beans, ½ cup / 2.6 mg °· Baked potato with skin / 2.7 mg °· Asparagus, ½ cup / 2.2 mg °· Avocado / 2 mg °· Dried peaches, ½ cup / 1.6 mg °· Raisins, ½ cup / 1.5 mg °· Soy milk,  1 cup / 1.5 mg °· Whole-wheat bread, 1 slice / 1.2 mg °· Spinach, 1 cup / 0.8 mg °· Broccoli, ½ cup / 0.6 mg °IRON ABSORPTION °Certain foods can decrease the body's absorption of iron. Try to avoid these foods and beverages while eating meals with iron-containing foods: °· Coffee. °· Tea. °· Fiber. °· Soy. °Foods containing vitamin C can help increase the amount of iron your body absorbs from iron sources, especially from nonheme sources. Eat foods with vitamin C along with iron-containing foods to increase your iron absorption. Foods that are high in vitamin C include many fruits and vegetables. Some good sources are: °· Fresh orange juice. °· Oranges. °· Strawberries. °· Mangoes. °· Grapefruit. °· Red bell peppers. °· Green bell peppers. °· Broccoli. °· Potatoes with skin. °· Tomato juice. °Document Released: 01/25/2005 Document Revised: 09/05/2011 Document Reviewed: 12/02/2010 °ExitCare® Patient Information ©2014 [employer]. ° °

## 2013-05-16 NOTE — Progress Notes (Signed)
[facility] Clinical Social Work  Clinical Social Work was referred by Press photographer for assessment of psychosocial needs.  Clinical Social Worker met with patient at [facility] after his radiation to offer support and assess for needs.  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 He came down to [place] from [place] after his wife's death several years ago. He shared he has no family in the area and limited supports. He reports to have his own car for transportation and can currently get himself to appointments. CSW educated Pt that he may need assistance later in his treatment and to seek CSW if this is a need. He shared concerns about food insecurity and feels he could qualify for food stamps. CSW explained to Pt how to apply at DSS and additional food resources.  Pt has yet to meet with a Artist. CSW explained to Pt that they can see if he qualifies for medication assistance.   CSW explained to pt the resources of the Patient/Family Support Team and how to access for further needs. Pt shared he had attended the yoga class and has found it very helpful. CSW provided Pt with Giving Tree Letter and will consider this as well. Pt appears to have limited resources and many needs.  CSW will follow as other needs arise.    Doreen Salvage, LCSW Clinical Social Worker [facility] for Patient & Family Support [facility] Wednesday, Thursday and Friday Phone: [phone] Fax: [phone]

## 2013-05-16 NOTE — Progress Notes (Signed)
OFFICE PROGRESS NOTE  Interval history:  Brendan Mosley returns for followup of recently diagnosed rectal cancer. He began radiation and concurrent Xeloda on 05/06/2013. He denies nausea/vomiting. No mouth sores. He denies diarrhea. He is taking a stool softener. He continues to have bleeding with bowel movements. He continues to have significant pain at the rectum. He started OxyContin yesterday. He ran out of Percocet about 1 week ago and has been unable to get a refill due to financial constraints. He denies hand or foot pain or redness. He intermittently notes discomfort with urination. He reports a good appetite eating 4-5 small meals per day.   Objective: Blood pressure 119/66, pulse 65, temperature 98.2 F (36.8 C).  Oropharynx is without thrush or ulceration. Lungs are clear. Regular cardiac rhythm. Abdomen soft and nontender. No hepatomegaly. Extremities without edema. Calves nontender. Palms without erythema.  Lab Results: Lab Results  Component Value Date   WBC 5.0 05/16/2013   HGB 9.8* 05/16/2013   HCT 30.2* 05/16/2013   MCV 82.8 05/16/2013   PLT 438* 05/16/2013    Chemistry:    Chemistry      Component Value Date/Time   NA 140 05/16/2013 1149   NA 144 04/05/2013 0937   K 4.1 05/16/2013 1149   K 3.6 04/05/2013 0937   CL 111 04/05/2013 0937   CO2 23 05/16/2013 1149   CO2 24 04/05/2013 0937   BUN 17.6 05/16/2013 1149   BUN 17 04/05/2013 0937   CREATININE 0.8 05/16/2013 1149   CREATININE 0.8 04/05/2013 0937      Component Value Date/Time   CALCIUM 9.4 05/16/2013 1149   CALCIUM 8.8 04/05/2013 0937   ALKPHOS 63 05/16/2013 1149   ALKPHOS 61 04/05/2013 0937   AST 16 05/16/2013 1149   AST 20 04/05/2013 0937   ALT 15 05/16/2013 1149   ALT 20 04/05/2013 0937   BILITOT 0.20 05/16/2013 1149   BILITOT 0.5 04/05/2013 1610       Studies/Results: US Biopsy  04/18/2013   INDICATION: History of rectal carcinoma, now with hypermetabolic liver lesion worrisome for  metastatic disease  EXAM: ULTRASOUND GUIDED LIVER LESION BIOPSY  MEDICATIONS: Fentanyl IV; Versed 2 mg IV  ANESTHESIA/SEDATION: Total Moderate Sedation time  14 minutes  COMPARISON:  PET-CT- 04/12/2013; CT abdomen and pelvis - 04/05/2013  PROCEDURE: Informed written consent was obtained from the patient after a discussion of the risks, benefits and alternatives to treatment. The patient understands and consents the procedure. A timeout was performed prior to the initiation of the procedure.  Ultrasound scanning was performed of the right upper abdominal quadrant demonstrates a 2.8 x 2.1 cm mixed echogenic solid lesion within the subcapsular aspect of the right lobe of the liver which correlates with the hypermetabolic liver lesions seen on preceding PET-CT.  The procedure was planned. The right upper abdominal quadrant was prepped and draped in the usual sterile fashion. The overlying soft tissues were anesthetized with 1% lidocaine with epinephrine. A 17 gauge, 6.8 cm co-axial needle was advanced into a peripheral aspect of the lesion. This was followed by 3 core biopsies with an 18 gauge core device under direct ultrasound guidance. Multiple ultrasound images were obtained for documentation purposes.  The co-axial needle was removed and hemostasis was obtained with manual compression. Post procedural scanning was negative for definitive area of hemorrhage or additional complication. A dressing was placed. The patient tolerated the procedure well without immediate post procedural complication.  COMPLICATIONS: None immediate  IMPRESSION: Technically successful ultrasound guided core  needle biopsy of lesion within the subcapsular aspect of the right lobe of the liver.   Electronically Signed   By: Simonne Come M.D.   On: 04/18/2013 16:10    Medications: I have reviewed the patient's current medications.  Assessment/Plan:  1. Rectal cancer, clinical stage IV, presenting with a rectal mass, perirectal  lymphadenopathy and a hypermetabolic liver lesion. 2. Pain/constipation/rectal bleeding secondary to #1. 3. History of hypertension. 4. Periodic discomfort with urination. Question radiation cystitis. 5. Anemia. Progressive. Likely due to blood loss and iron deficiency.  Disposition-he appears stable. He will continue radiation and Xeloda.  He has progressive anemia on labs today. We will begin CBCs on a weekly basis. We will obtain a ferritin with his next lab draw. He was given information on foods that are high in iron.  He is having difficulty obtaining medications due to financial constraints. We will request for the social worker to meet with him today.  Dr. Truett Perna will see Brendan Mosley in followup on 06/06/2013. Brendan Mosley knows to contact the office in the interim with any problems.  Plan reviewed with Dr. Truett Perna.  Lonna Cobb ANP/GNP-BC

## 2013-05-16 NOTE — Progress Notes (Signed)
[facility] Clinical Social Work  Clinical Social Work was referred by nurse for assessment of psychosocial needs.  Clinical Social Worker plans to meet with patient at [facility] today to offer support and assess for needs.    Full assessment to follow after meeting with Pt later today.  Doreen Salvage, LCSW Clinical Social Worker [facility] for Patient & Family Support [facility] Wednesday, Thursday and Friday Phone: [phone] Fax: [phone]

## 2013-05-16 NOTE — Progress Notes (Signed)
Met with patient to assess for needs.  He reports financial difficulty with paying for pain meds.  The NP who saw him today contacted SW about helping with that.  He requested nutritional supplements to help with his diet.  This RN left message with Vernell Leep to possibly offer samples.  She will be in touch with the patient.  This RN asked patient if there were other needs or barriers to care at this time and he stated only the financial that hopefully SW can assist with.  This RN reminded patient to call if needs arise.  He expressed appreciation and stated that he would.  Will continue to follow.

## 2013-05-17 ENCOUNTER — Encounter: Payer: Self-pay | Admitting: Radiation Oncology

## 2013-05-17 ENCOUNTER — Ambulatory Visit
Admission: RE | Admit: 2013-05-17 | Discharge: 2013-05-17 | Disposition: A | Discharge status: Home / Self Care | Payer: BC Managed Care – PPO | Source: Ambulatory Visit | Attending: Radiation Oncology | Admitting: Radiation Oncology

## 2013-05-17 ENCOUNTER — Encounter: Payer: Self-pay | Admitting: Nutrition

## 2013-05-17 VITALS — BP 147/70 | HR 74 | Temp 97.7°F | Resp 20 | Wt 147.1 lb

## 2013-05-17 DIAGNOSIS — C20 Malignant neoplasm of rectum: Secondary | ICD-10-CM

## 2013-05-17 NOTE — Progress Notes (Signed)
I provided patient with one case of Ensure Plus.

## 2013-05-17 NOTE — Progress Notes (Signed)
Patient needs help with meds. He is going to bring back bank statement and leave for Sharyl Nimrod or Marylene Land. I allow meds for today and he will bring back the bank statement on Sun or Mon and leave a copy for them.

## 2013-05-17 NOTE — Progress Notes (Signed)
   Department of Radiation Oncology  Phone:  [phone] Fax:        [phone]  Weekly Treatment Note    Name: Brendan Mosley Date: 05/17/2013 MRN: [id number] DOB: April 26, 1951   Current dose: 18 Gy  Current fraction: 10   MEDICATIONS: Current Outpatient Prescriptions  Medication Sig Dispense Refill  . capecitabine (XELODA) 500 MG tablet Take 3 tablets (1,500 mg total) by mouth 2 (two) times daily after a meal. Take on days of radiation only (Mon- Fri)  168 tablet  0  . docusate sodium (COLACE) 100 MG capsule Take 1 capsule (100 mg total) by mouth 2 (two) times daily.  60 capsule  0  . Hydrocortisone Acetate 1 % OINT Apply to affected area prn.  30 g  1  . OxyCODONE (OXYCONTIN) 15 mg T12A 12 hr tablet Take 1 tablet (15 mg total) by mouth every 12 (twelve) hours.  60 tablet  0  . oxyCODONE-acetaminophen (PERCOCET) 7.5-325 MG per tablet Take 1-2 tablets by mouth every 4 (four) hours as needed for pain. Do not take more than 10 tablets per day.  60 tablet  0   No current facility-administered medications for this encounter.     ALLERGIES: Review of patient's allergies indicates no known allergies.   LABORATORY DATA:  Lab Results  Component Value Date   WBC 5.0 05/16/2013   HGB 9.8* 05/16/2013   HCT 30.2* 05/16/2013   MCV 82.8 05/16/2013   PLT 438* 05/16/2013   Lab Results  Component Value Date   NA 140 05/16/2013   K 4.1 05/16/2013   CL 111 04/05/2013   CO2 23 05/16/2013   Lab Results  Component Value Date   ALT 15 05/16/2013   AST 16 05/16/2013   ALKPHOS 63 05/16/2013   BILITOT 0.20 05/16/2013     NARRATIVE: Brendan Mosley was seen today for weekly treatment management. The chart was checked and the patient's films were reviewed. The patient states that treatment has been going fine so far. The patient was able to get the long acting pain medicine which was prescribed for him. However he was unable to get the Percocet also. He is hoping to do this and also  is working with the Child psychotherapist.  PHYSICAL EXAMINATION: weight is 147 lb 1.6 oz (66.724 kg). His oral temperature is 97.7 F (36.5 C). His blood pressure is 147/70 and his pulse is 74. His respiration is 20.        ASSESSMENT: The patient is doing satisfactorily with treatment.  PLAN: We will continue with the patient's radiation treatment as planned.

## 2013-05-17 NOTE — Progress Notes (Signed)
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

## 2013-05-19 ENCOUNTER — Ambulatory Visit
Admission: RE | Admit: 2013-05-19 | Discharge: 2013-05-19 | Disposition: A | Discharge status: Home / Self Care | Payer: BC Managed Care – PPO | Source: Ambulatory Visit | Attending: Radiation Oncology | Admitting: Radiation Oncology

## 2013-05-20 ENCOUNTER — Ambulatory Visit
Admission: RE | Admit: 2013-05-20 | Discharge: 2013-05-20 | Disposition: A | Discharge status: Home / Self Care | Payer: BC Managed Care – PPO | Source: Ambulatory Visit | Attending: Radiation Oncology | Admitting: Radiation Oncology

## 2013-05-21 ENCOUNTER — Ambulatory Visit
Admission: RE | Admit: 2013-05-21 | Discharge: 2013-05-21 | Disposition: A | Discharge status: Home / Self Care | Payer: BC Managed Care – PPO | Source: Ambulatory Visit | Attending: Radiation Oncology | Admitting: Radiation Oncology

## 2013-05-21 ENCOUNTER — Telehealth: Payer: Self-pay | Admitting: Nurse Practitioner

## 2013-05-21 NOTE — Telephone Encounter (Signed)
pt walked in requesting a neulasta injection I saw no orders Email to [employer] Pt will come back by tomorrow after rad onc tx shh

## 2013-05-22 ENCOUNTER — Other Ambulatory Visit (HOSPITAL_BASED_OUTPATIENT_CLINIC_OR_DEPARTMENT_OTHER): Payer: BC Managed Care – PPO

## 2013-05-22 ENCOUNTER — Ambulatory Visit
Admission: RE | Admit: 2013-05-22 | Discharge: 2013-05-22 | Disposition: A | Discharge status: Home / Self Care | Payer: BC Managed Care – PPO | Source: Ambulatory Visit | Attending: Radiation Oncology | Admitting: Radiation Oncology

## 2013-05-22 DIAGNOSIS — C20 Malignant neoplasm of rectum: Secondary | ICD-10-CM

## 2013-05-22 LAB — CBC WITH DIFFERENTIAL/PLATELET
BASO%: 0.3 % (ref 0.0–2.0)
Basophils Absolute: 0 10*3/uL (ref 0.0–0.1)
EOS%: 22.4 % — ABNORMAL HIGH (ref 0.0–7.0)
LYMPH%: 6.9 % — ABNORMAL LOW (ref 14.0–49.0)
MCH: 27.5 pg (ref 27.2–33.4)
MCHC: 32.8 g/dL (ref 32.0–36.0)
NEUT#: 3.9 10*3/uL (ref 1.5–6.5)
RDW: 16.3 % — ABNORMAL HIGH (ref 11.0–14.6)
lymph#: 0.4 10*3/uL — ABNORMAL LOW (ref 0.9–3.3)

## 2013-05-24 ENCOUNTER — Encounter: Payer: Self-pay | Admitting: *Deleted

## 2013-05-24 NOTE — Progress Notes (Signed)
RECEIVED A FAX FROM [facility] CONCERNING A CONFIRMATION OF PRESCRIPTION SHIPMENT FOR CAPECITABINE ON 05/21/13.

## 2013-05-27 ENCOUNTER — Encounter: Payer: Self-pay | Admitting: Radiation Oncology

## 2013-05-27 ENCOUNTER — Ambulatory Visit
Admission: RE | Admit: 2013-05-27 | Discharge: 2013-05-27 | Disposition: A | Discharge status: Home / Self Care | Payer: BC Managed Care – PPO | Source: Ambulatory Visit | Attending: Radiation Oncology | Admitting: Radiation Oncology

## 2013-05-27 ENCOUNTER — Encounter: Payer: Self-pay | Admitting: Nutrition

## 2013-05-27 VITALS — BP 142/67 | HR 74 | Temp 97.9°F | Resp 20 | Wt 135.4 lb

## 2013-05-27 DIAGNOSIS — C20 Malignant neoplasm of rectum: Secondary | ICD-10-CM

## 2013-05-27 MED ORDER — HYDROCORTISONE ACETATE 1 % EX OINT: TOPICAL_OINTMENT | CUTANEOUS | Status: DC

## 2013-05-27 MED ORDER — LIDOCAINE HCL 2 % EX GEL: 1.0000 "application " | CUTANEOUS | Status: AC | PRN

## 2013-05-27 NOTE — Progress Notes (Signed)
   Department of Radiation Oncology  Phone:  [phone] Fax:        [phone]  Weekly Treatment Note    Name: Brendan Mosley Date: 05/27/2013 MRN: [id number] DOB: [birth year]   Current dose: 27 Gy  Current fraction: 15   MEDICATIONS: Current Outpatient Prescriptions  Medication Sig Dispense Refill  . capecitabine (XELODA) 500 MG tablet Take 3 tablets (1,500 mg total) by mouth 2 (two) times daily after a meal. Take on days of radiation only (Mon- Fri)  168 tablet  0  . docusate sodium (COLACE) 100 MG capsule Take 1 capsule (100 mg total) by mouth 2 (two) times daily.  60 capsule  0  . OxyCODONE (OXYCONTIN) 15 mg T12A 12 hr tablet Take 1 tablet (15 mg total) by mouth every 12 (twelve) hours.  60 tablet  0  . oxyCODONE-acetaminophen (PERCOCET) 7.5-325 MG per tablet Take 1-2 tablets by mouth every 4 (four) hours as needed for pain. Do not take more than 10 tablets per day.  60 tablet  0  . lidocaine (XYLOCAINE JELLY) 2 % jelly Apply 1 application topically as needed.  30 mL  1   No current facility-administered medications for this encounter.     ALLERGIES: Review of patient's allergies indicates no known allergies.   LABORATORY DATA:  Lab Results  Component Value Date   WBC 6.1 05/22/2013   HGB 10.2* 05/22/2013   HCT 31.0* 05/22/2013   MCV 83.7 05/22/2013   PLT 321 05/22/2013   Lab Results  Component Value Date   NA 140 05/16/2013   K 4.1 05/16/2013   CL 111 04/05/2013   CO2 23 05/16/2013   Lab Results  Component Value Date   ALT 15 05/16/2013   AST 16 05/16/2013   ALKPHOS 63 05/16/2013   BILITOT 0.20 05/16/2013     NARRATIVE: Brendan Mosley was seen today for weekly treatment management. The chart was checked and the patient's films were reviewed. The patient is complaining of irritation in the scrotal region. He needs additional cream which he has been using in the anal region.  PHYSICAL EXAMINATION: weight is 135 lb 6.4 oz (61.417 kg). His oral  temperature is 97.9 F (36.6 C). His blood pressure is 142/67 and his pulse is 74. His respiration is 20.      some erythema present in the scrotal region with a couple of areas of emerging desquamation.  ASSESSMENT: The patient is doing satisfactorily with treatment.  PLAN: We will continue with the patient's radiation treatment as planned. The patient was given Neosporin to use on the affected areas. He wants something stronger and we gave him a prescription for lidocaine cream.

## 2013-05-27 NOTE — Progress Notes (Signed)
Provided 2nd case of Ensure Plus for patient.
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
## 2013-05-28 ENCOUNTER — Telehealth: Payer: Self-pay | Admitting: *Deleted

## 2013-05-28 ENCOUNTER — Ambulatory Visit
Admission: RE | Admit: 2013-05-28 | Discharge: 2013-05-28 | Disposition: A | Discharge status: Home / Self Care | Payer: BC Managed Care – PPO | Source: Ambulatory Visit | Attending: Radiation Oncology | Admitting: Radiation Oncology

## 2013-05-28 NOTE — Telephone Encounter (Signed)
Called [facility] patient pharmacy [phone] with Arlys John, pharmacist , BJ:[id number] jelly, 30 ml tube, apply topical as needed to affected skin area, with 1 refill pre Dr.Moody, patient is getting his rx's [facility] now [facility] of [facility] [address], called [facility] to inform patient he can pick up rx there,"he said he has a n acct there and can get it free", [facility] [address] and canceelled the rx e-scribed yesterday there by MD 2:00 PM

## 2013-05-28 NOTE — Telephone Encounter (Signed)
Called [facility] [phone] spoke with Zollie Scale ,pharmacist,cancelling the rx for lidocaine jelly for patient, getting this at [facility] out pt pharmacy instead, Zollie Scale thanked me for calling, she will cancel this rx 2:03 PM

## 2013-05-28 NOTE — Telephone Encounter (Signed)
[facility] spoke with Shirley Muscat cancelling rx for the lidocaine jelly e-scribed 05/27/13 by Dr.Moody 2:05 PM'

## 2013-05-29 ENCOUNTER — Ambulatory Visit
Admission: RE | Admit: 2013-05-29 | Discharge: 2013-05-29 | Disposition: A | Discharge status: Home / Self Care | Payer: BC Managed Care – PPO | Source: Ambulatory Visit | Attending: Radiation Oncology | Admitting: Radiation Oncology

## 2013-05-30 ENCOUNTER — Other Ambulatory Visit: Payer: BC Managed Care – PPO

## 2013-05-30 ENCOUNTER — Ambulatory Visit
Admission: RE | Admit: 2013-05-30 | Discharge: 2013-05-30 | Disposition: A | Discharge status: Home / Self Care | Payer: BC Managed Care – PPO | Source: Ambulatory Visit | Attending: Radiation Oncology | Admitting: Radiation Oncology

## 2013-05-30 ENCOUNTER — Telehealth: Payer: Self-pay | Admitting: *Deleted

## 2013-05-30 ENCOUNTER — Encounter: Payer: Self-pay | Admitting: Radiation Oncology

## 2013-05-30 VITALS — BP 113/84 | HR 99 | Temp 98.5°F | Resp 20 | Wt 139.6 lb

## 2013-05-30 DIAGNOSIS — C20 Malignant neoplasm of rectum: Secondary | ICD-10-CM

## 2013-05-30 NOTE — Telephone Encounter (Signed)
Spoke with pt, he reports he has already taken his Xeloda today. He reports relief of burning after using Aquaphor.  Instructed him to HOLD XELODA. Stop by office on 12/5 to have hands evaluated. Pt hesitant to stop Xeloda, agreed to do so. Stated he will come by to see RN for assessment.

## 2013-05-30 NOTE — Telephone Encounter (Signed)
Called and left vm for Dr.Sherrill's nurse per Dr.Moody, that the patient  Was asking for nurse to call him to see about his hands and feet ,weak, can;t open even a bottle with his hands now, cracked hands and feet, I did give numerous samples of aquaphor to see if this would help the cracked skin

## 2013-05-30 NOTE — Progress Notes (Addendum)
Weekly rad txs rectum 18/25 completed , c/o lidocaine jelly burns when applying to affected skin area on buttocks and in between his testicles, skin is irritated erythema, says open sore in between buttocks, c/o hands and feet hurting sore, cracked skin, gave several samples of aquaphor to patient, fatigued, says he need other lotion instead of the lidocaine, had a lot of stools today stated, solid mostly today, encouraged if starting to have diarrhea to get imodium ad otc 3:07 PM  3:04 PM

## 2013-05-30 NOTE — Telephone Encounter (Signed)
Per Dr. Truett Perna: Hold Xeloda Fri (and over the weekend). Let us know how hands feel on Monday. I have attempted to call pt at # listed several times. Unable to leave detailed message on unidentified phone call.

## 2013-05-30 NOTE — Telephone Encounter (Signed)
Received message from [place] [facility] to call pt re: hand/ foot syndrome. Left message on voicemail for pt to call office.

## 2013-05-30 NOTE — Progress Notes (Signed)
Patient also stated he couldn't even open a bottle top ,his hands and feet are so weak 3:16 PM

## 2013-05-30 NOTE — Progress Notes (Signed)
   Department of Radiation Oncology  Phone:  [phone] Fax:        [phone]  Weekly Treatment Note    Name: Brendan Mosley Date: 05/30/2013 MRN: [id number] DOB: Aug 16, 1950   Current dose: 32.4 Gy  Current fraction: 18   MEDICATIONS: Current Outpatient Prescriptions  Medication Sig Dispense Refill  . capecitabine (XELODA) 500 MG tablet Take 3 tablets (1,500 mg total) by mouth 2 (two) times daily after a meal. Take on days of radiation only (Mon- Fri)  168 tablet  0  . docusate sodium (COLACE) 100 MG capsule Take 1 capsule (100 mg total) by mouth 2 (two) times daily.  60 capsule  0  . lidocaine (XYLOCAINE JELLY) 2 % jelly Apply 1 application topically as needed.  30 mL  1  . OxyCODONE (OXYCONTIN) 15 mg T12A 12 hr tablet Take 1 tablet (15 mg total) by mouth every 12 (twelve) hours.  60 tablet  0  . oxyCODONE-acetaminophen (PERCOCET) 7.5-325 MG per tablet Take 1-2 tablets by mouth every 4 (four) hours as needed for pain. Do not take more than 10 tablets per day.  60 tablet  0   No current facility-administered medications for this encounter.     ALLERGIES: Review of patient's allergies indicates no known allergies.   LABORATORY DATA:  Lab Results  Component Value Date   WBC 6.1 05/22/2013   HGB 10.2* 05/22/2013   HCT 31.0* 05/22/2013   MCV 83.7 05/22/2013   PLT 321 05/22/2013   Lab Results  Component Value Date   NA 140 05/16/2013   K 4.1 05/16/2013   CL 111 04/05/2013   CO2 23 05/16/2013   Lab Results  Component Value Date   ALT 15 05/16/2013   AST 16 05/16/2013   ALKPHOS 63 05/16/2013   BILITOT 0.20 05/16/2013     NARRATIVE: Brendan Mosley was seen today for weekly treatment management. The chart was checked and the patient's films were reviewed. The patient states that he is doing fairly well. He complained of some burning with the lidocaine cream. I discussed with him that this is normal when first apply but generally is very helpful.  PHYSICAL  EXAMINATION: weight is 139 lb 9.6 oz (63.322 kg). His oral temperature is 98.5 F (36.9 C). His blood pressure is 113/84 and his pulse is 99. His respiration is 20.        ASSESSMENT: The patient is doing satisfactorily with treatment.  PLAN: We will continue with the patient's radiation treatment as planned. The patient is doing satisfactorily. His treatment setup has been slightly altered to reduce the radiation dose to the scrotal area.

## 2013-05-31 ENCOUNTER — Ambulatory Visit
Admission: RE | Admit: 2013-05-31 | Discharge: 2013-05-31 | Disposition: A | Discharge status: Home / Self Care | Payer: BC Managed Care – PPO | Source: Ambulatory Visit | Attending: Radiation Oncology | Admitting: Radiation Oncology

## 2013-05-31 ENCOUNTER — Telehealth: Payer: Self-pay | Admitting: *Deleted

## 2013-05-31 NOTE — Telephone Encounter (Signed)
Pt came to office to have hands evaluated. Erythema noted, no desquamation. Pt reports hands are tender but feel better since he began using Aquaphor. He reports he took Xeloda as prescribed this morning because his hands felt better. Denies diarrhea or mouth sores. Reviewed with Dr. Truett Perna: If hands are more painful/ peeling 12/8, HOLD Xeloda and call office. Pt voiced understanding.

## 2013-06-03 ENCOUNTER — Ambulatory Visit
Admission: RE | Admit: 2013-06-03 | Discharge: 2013-06-03 | Disposition: A | Discharge status: Home / Self Care | Payer: BC Managed Care – PPO | Source: Ambulatory Visit | Attending: Radiation Oncology | Admitting: Radiation Oncology

## 2013-06-04 ENCOUNTER — Ambulatory Visit
Admission: RE | Admit: 2013-06-04 | Discharge: 2013-06-04 | Disposition: A | Discharge status: Home / Self Care | Payer: BC Managed Care – PPO | Source: Ambulatory Visit | Attending: Radiation Oncology | Admitting: Radiation Oncology

## 2013-06-05 ENCOUNTER — Ambulatory Visit
Admission: RE | Admit: 2013-06-05 | Discharge: 2013-06-05 | Disposition: A | Discharge status: Home / Self Care | Payer: BC Managed Care – PPO | Source: Ambulatory Visit | Attending: Radiation Oncology | Admitting: Radiation Oncology

## 2013-06-06 ENCOUNTER — Other Ambulatory Visit (HOSPITAL_BASED_OUTPATIENT_CLINIC_OR_DEPARTMENT_OTHER): Payer: BC Managed Care – PPO

## 2013-06-06 ENCOUNTER — Telehealth: Payer: Self-pay | Admitting: Oncology

## 2013-06-06 ENCOUNTER — Encounter: Payer: Self-pay | Admitting: *Deleted

## 2013-06-06 ENCOUNTER — Ambulatory Visit
Admission: RE | Admit: 2013-06-06 | Discharge: 2013-06-06 | Disposition: A | Discharge status: Home / Self Care | Payer: BC Managed Care – PPO | Source: Ambulatory Visit | Attending: Radiation Oncology | Admitting: Radiation Oncology

## 2013-06-06 ENCOUNTER — Ambulatory Visit (HOSPITAL_BASED_OUTPATIENT_CLINIC_OR_DEPARTMENT_OTHER): Payer: BC Managed Care – PPO | Admitting: Oncology

## 2013-06-06 VITALS — BP 143/89 | HR 109 | Temp 97.5°F | Resp 20 | Ht 68.0 in | Wt 131.0 lb

## 2013-06-06 DIAGNOSIS — B370 Candidal stomatitis: Secondary | ICD-10-CM

## 2013-06-06 DIAGNOSIS — R197 Diarrhea, unspecified: Secondary | ICD-10-CM

## 2013-06-06 DIAGNOSIS — C20 Malignant neoplasm of rectum: Secondary | ICD-10-CM

## 2013-06-06 DIAGNOSIS — T451X5A Adverse effect of antineoplastic and immunosuppressive drugs, initial encounter: Secondary | ICD-10-CM

## 2013-06-06 DIAGNOSIS — L270 Generalized skin eruption due to drugs and medicaments taken internally: Secondary | ICD-10-CM

## 2013-06-06 LAB — CBC WITH DIFFERENTIAL/PLATELET
BASO%: 0.2 % (ref 0.0–2.0)
EOS%: 2.8 % (ref 0.0–7.0)
Eosinophils Absolute: 0.1 10*3/uL (ref 0.0–0.5)
MCH: 28.5 pg (ref 27.2–33.4)
MCHC: 33.1 g/dL (ref 32.0–36.0)
MCV: 86 fL (ref 79.3–98.0)
MONO#: 0.5 10*3/uL (ref 0.1–0.9)
MONO%: 20.6 % — ABNORMAL HIGH (ref 0.0–14.0)
NEUT#: 1.7 10*3/uL (ref 1.5–6.5)
NEUT%: 68.3 % (ref 39.0–75.0)
Platelets: 492 10*3/uL — ABNORMAL HIGH (ref 140–400)
RBC: 3.89 10*6/uL — ABNORMAL LOW (ref 4.20–5.82)
RDW: 20.3 % — ABNORMAL HIGH (ref 11.0–14.6)
WBC: 2.4 10*3/uL — ABNORMAL LOW (ref 4.0–10.3)

## 2013-06-06 MED ORDER — OXYCODONE-ACETAMINOPHEN 7.5-325 MG PO TABS: 1.0000 | ORAL_TABLET | ORAL | Status: DC | PRN

## 2013-06-06 MED ORDER — FLUCONAZOLE 100 MG PO TABS: 100.0000 mg | ORAL_TABLET | Freq: Every day | ORAL | Status: DC

## 2013-06-06 MED ORDER — OXYCODONE HCL ER 15 MG PO T12A: 15.0000 mg | EXTENDED_RELEASE_TABLET | Freq: Two times a day (BID) | ORAL | Status: DC

## 2013-06-06 MED ORDER — LOPERAMIDE HCL 2 MG PO CAPS: 2.0000 mg | ORAL_CAPSULE | ORAL | Status: DC | PRN

## 2013-06-06 NOTE — Progress Notes (Signed)
[facility] Clinical Social Work  Clinical Social Work was referred by patient for concerns related to housing and finances.  Clinical Social Worker met with patient at [facility] prior to his radiation treatment on 06/05/13 to offer support and assess for needs.  Pt reports his housing closes over the holidays due to school being out of session for winter break and he is not sure where he will stay. CSW problem solved with Pt and offered to call housing manager in attempt to assist with arrangements. Pt declined and felt he could find a place to stay. Pt reports he has continued issues with food, but has not attempted to apply for food stamps or other assistance. He also still needs to meet with the financial counselor. Pt very tired from treatment and finishing his exams. Pt plans to work on housing options and CSW will reassess Pt later this afternoon when he returns to [facility].     Clinical Social Work interventions: Supportive Mining engineer and referral Problem solving  Doreen Salvage, LCSW Clinical Social Worker [facility] for Patient & Family Support [facility] Wednesday, Thursday and Friday Phone: [phone] Fax: [phone]

## 2013-06-06 NOTE — Telephone Encounter (Signed)
gv and pritned appt sched and avs for pt for DEC °

## 2013-06-06 NOTE — Progress Notes (Signed)
Met with patient to assess for barriers to care.  He is currently working with SW to figure out arrangements for housing over the holidays.  He stated he had been given information on resources for food and food stamps.  He has a car and is providing his own transportation.  He requested a case of Ensure from dietician.  A message was left with dietician requesting a case for patient to pick up on 06/07/13 after radiation. Patient reports feeling the effects of radiation and chemo.  He stated he took his last exam today and that he looks forward to completing his treatments next week.  Patient denied other needs at present time and stated SW is helping him with above.  Reminded patient to call if any barriers to care arise.

## 2013-06-06 NOTE — Patient Instructions (Signed)
STOP XELODA. Take Imodium for loose stools. You may take up to 8 tablets in 24 hours. Call the office if loose stools persist.

## 2013-06-06 NOTE — Progress Notes (Signed)
   [facility]    OFFICE PROGRESS NOTE   INTERVAL HISTORY:   [name] returns for a scheduled followup visit. He continues daily radiation and Xeloda. He denies mouth sores. He has "diarrhea "up to 10 times per day. He is not taking Imodium. He has developed pain at the hands and feet. He is taking oxycodone for pain at the rectum and scrotum. He is drinking adequate fluids. No mouth ulcers.  Objective:  Vital signs in last 24 hours:  Blood pressure 143/89, pulse 109, temperature 97.5 F (36.4 C), temperature source Oral, resp. rate 20, height 5\' 8"  (1.727 m), weight 131 lb (59.421 kg).    HEENT: Bilateral buccal thrush, question mark early ulcerations Resp: Lungs clear bilaterally Cardio: Regular rate and rhythm GI: No hepatosplenomegaly, nontender Vascular: No leg edema  Skin: There is erythema over the hands and feet. Superficial desquamation over the scrotum, perineum, and gluteal fold. Bleeding from the scrotum.   Portacath/PICC-without erythema  Lab Results:  Lab Results  Component Value Date   WBC 2.4* 06/06/2013   HGB 11.1* 06/06/2013   HCT 33.4* 06/06/2013   MCV 86.0 06/06/2013   PLT 492* 06/06/2013   ANC 1.7    Medications: I have reviewed the patient's current medications.  Assessment/Plan: 1. Rectal cancer, clinical stage IV, presenting with a rectal mass, perirectal lymphadenopathy and a hypermetabolic liver lesion.  Initiation of concurrent Xeloda and radiation on 05/06/2013 2. Pain/constipation/rectal bleeding secondary to #1. Improvement in the "rectal "pain since beginning treatment 3. History of hypertension. 4. 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 Anemia- Iron deficiency 6. Skin breakdown at the scrotum and perineum 7. "Diarrhea " 8. Oral candidiasis-he will begin a course of Diflucan   Disposition:  [name] began concurrent Xeloda and radiation on 05/06/2013. He has developed toxicity from treatment with  diarrhea, hand/foot syndrome, and breakdown at the scrotum/perineum. He is losing weight. We will place Xeloda on hold until he returns for an office visit on 06/11/2013. He will be treated with Diflucan for oral candidiasis. I encouraged him to push oral fluids and use Imodium for diarrhea.He will continue OxyContin/oxycodone for pain. He was given prescriptions today.   Thornton Papas, MD  06/06/2013  4:33 PM

## 2013-06-07 ENCOUNTER — Encounter: Payer: Self-pay | Admitting: Radiation Oncology

## 2013-06-07 ENCOUNTER — Ambulatory Visit: Admission: RE | Admit: 2013-06-07 | Payer: BC Managed Care – PPO | Source: Ambulatory Visit

## 2013-06-07 ENCOUNTER — Telehealth: Payer: Self-pay | Admitting: *Deleted

## 2013-06-07 ENCOUNTER — Other Ambulatory Visit: Payer: Self-pay | Admitting: *Deleted

## 2013-06-07 ENCOUNTER — Encounter: Payer: BC Managed Care – PPO | Admitting: Nutrition

## 2013-06-07 ENCOUNTER — Encounter: Payer: Self-pay | Admitting: *Deleted

## 2013-06-07 ENCOUNTER — Encounter: Payer: Self-pay | Admitting: Nutrition

## 2013-06-07 ENCOUNTER — Ambulatory Visit
Admission: RE | Admit: 2013-06-07 | Discharge: 2013-06-07 | Disposition: A | Discharge status: Home / Self Care | Payer: BC Managed Care – PPO | Source: Ambulatory Visit | Attending: Radiation Oncology | Admitting: Radiation Oncology

## 2013-06-07 VITALS — BP 119/68 | HR 65 | Temp 98.5°F | Resp 20 | Wt 128.3 lb

## 2013-06-07 DIAGNOSIS — C20 Malignant neoplasm of rectum: Secondary | ICD-10-CM

## 2013-06-07 MED ORDER — FERROUS SULFATE 325 (65 FE) MG PO TBEC: DELAYED_RELEASE_TABLET | ORAL | Status: DC

## 2013-06-07 MED ORDER — SILVER SULFADIAZINE 1 % EX CREA
TOPICAL_CREAM | Freq: Every day | CUTANEOUS | Status: DC
  Administered 2013-06-07: 17:00:00 via TOPICAL

## 2013-06-07 NOTE — Progress Notes (Signed)
  Radiation Oncology         [phone]) [phone] ________________________________  Name: Brendan Mosley MRN: [id number]  Date: 06/06/2013  DOB: 10/15/1950  COMPLEX SIMULATION  NOTE  Diagnosis: rectal cancer  Narrative The patient has initially been planned to receive a course of radiation treatment to a dose of 45 gray in 25 fractions at 1.8 gray per fraction. The patient will now receive a boost to the high risk target volume for an additional 5.4 gray. This will be delivered in 3 fractions at 1.8 gray per fraction and a cone down boost technique will be utilized. To accomplish this, an additional 4 customized blocks have been designed for this purpose. A complex isodose plan is requested to ensure that the high-risk target region receives the appropriate radiation dose and that the nearby normal structures continue to be appropriately spared. The patient's final total dose therefore will be 50.4 gray.   ________________________________ ------------------------------------------------  Radene Gunning, MD, PhD

## 2013-06-07 NOTE — Telephone Encounter (Signed)
CHECK TO MAKE SURE PT. HAS STOPPED HIS PO CHEMO MEDICATION.

## 2013-06-07 NOTE — Progress Notes (Signed)
Weekly rad txs, to be seen before tx  Today, has had 23 txs so far returm, very week and having  Diarrhea past 2 days,is taking imodium but persists, c/o pain in his bottom, asked to check his skin, checked in between his scrotum, and in between his buttocks moist desquamation  In those area, pt c/o mostly diarrhea and weakness, drinking water and gingerale 2:24 PM

## 2013-06-07 NOTE — Progress Notes (Signed)
[facility] Clinical Social Work  Clinical Social Work continues to follow for issues of food insecurity, housing and financial hardship. CSW has provided Pt with list of free meals, how to apply for food stamps and housing resources. Pt lives in off campus student housing that he has to vacate in the next week or two. He has gotten permission to stay longer than most students. CSW offered to call on his behalf, but Pt declined. 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 CSW inquired if he had tapped into any resources through school, but he declined as he currently does not want school to know his health issues. CSW attempted to educate Pt on how the school could assist, but he declined. CSW hopeful Pt will follow up on the many resources and options provided to him that could assist.  Pt reports to have a very limited support network and has no one as a next of kin. CSW attempted to educate Pt on how a HCPOA could be very helpful, but states he "has no one". CSW to follow up next week and reassess needs. Pt appreciative, but is very weak today.   Clinical Social Work interventions: Building surveyor, Theatre stage manager and referral.  Doreen Salvage, LCSW Clinical Social Worker [facility] Center for Patient & Family Support [facility] Wednesday, Thursday and Friday Phone: [phone] Fax: [phone]

## 2013-06-07 NOTE — Progress Notes (Signed)
   Department of Radiation Oncology  Phone:  [phone] Fax:        [phone]  Weekly Treatment Note    Name: Brendan Mosley Date: 06/07/2013 MRN: [id number] DOB: 10/28/1950   Current dose: 41.4 Gy  Current fraction: 23   MEDICATIONS: Current Outpatient Prescriptions  Medication Sig Dispense Refill  . capecitabine (XELODA) 500 MG tablet Take 3 tablets (1,500 mg total) by mouth 2 (two) times daily after a meal. Take on days of radiation only (Mon- Fri)  168 tablet  0  . docusate sodium (COLACE) 100 MG capsule Take 1 capsule (100 mg total) by mouth 2 (two) times daily.  60 capsule  0  . fluconazole (DIFLUCAN) 100 MG tablet Take 1 tablet (100 mg total) by mouth daily.  5 tablet  0  . lidocaine (XYLOCAINE JELLY) 2 % jelly Apply 1 application topically as needed.  30 mL  1  . loperamide (IMODIUM) 2 MG capsule Take 1 capsule (2 mg total) by mouth as needed for diarrhea or loose stools. Take up to 8 tablets/ day  60 capsule  6  . OxyCODONE (OXYCONTIN) 15 mg T12A 12 hr tablet Take 1 tablet (15 mg total) by mouth every 12 (twelve) hours.  60 tablet  0  . oxyCODONE-acetaminophen (PERCOCET) 7.5-325 MG per tablet Take 1-2 tablets by mouth every 4 (four) hours as needed for pain. Do not take more than 10 tablets per day.  60 tablet  0   No current facility-administered medications for this encounter.     ALLERGIES: Review of patient's allergies indicates no known allergies.   LABORATORY DATA:  Lab Results  Component Value Date   WBC 2.4* 06/06/2013   HGB 11.1* 06/06/2013   HCT 33.4* 06/06/2013   MCV 86.0 06/06/2013   PLT 492* 06/06/2013   Lab Results  Component Value Date   NA 140 05/16/2013   K 4.1 05/16/2013   CL 111 04/05/2013   CO2 23 05/16/2013   Lab Results  Component Value Date   ALT 15 05/16/2013   AST 16 05/16/2013   ALKPHOS 63 05/16/2013   BILITOT 0.20 05/16/2013     NARRATIVE: Brendan Mosley was seen today for weekly treatment management. The chart was  checked and the patient's films were reviewed. The patient is complaining of significant irritation. Also having some diarrhea today. He has taken a couple of Imodium.  PHYSICAL EXAMINATION: weight is 128 lb 4.8 oz (58.196 kg). His oral temperature is 98.5 F (36.9 C). His blood pressure is 119/68 and his pulse is 65. His respiration is 20.      moist desquamation is present within the anal region extending to the posterior scrotal area.  ASSESSMENT: The patient is doing satisfactorily with treatment.  PLAN: We will continue with the patient's radiation treatment as planned. The patient was given Silvadene cream today to begin using. We will hold off on treatment today. The patient did wish to do this and I think that this is reasonable. He will increase his Imodium throughout the day as needed with further loose stools. Hopefully we can resume his treatment next Monday and finish next week.

## 2013-06-07 NOTE — Addendum Note (Signed)
Encounter addended by: Lowella Petties, RN on: 06/07/2013  5:02 PM<BR>     Documentation filed: Inpatient MAR, Orders

## 2013-06-07 NOTE — Telephone Encounter (Signed)
NOTIFIED BIOLOGICS THAT PT.'S CAPECITABINE IS ON HOLD UNTIL 06/11/13.

## 2013-06-07 NOTE — Addendum Note (Signed)
Encounter addended by: Lowella Petties, RN on: 06/07/2013  5:01 PM<BR>     Documentation filed: Inpatient MAR

## 2013-06-10 ENCOUNTER — Ambulatory Visit
Admission: RE | Admit: 2013-06-10 | Discharge: 2013-06-10 | Disposition: A | Discharge status: Home / Self Care | Payer: BC Managed Care – PPO | Source: Ambulatory Visit | Attending: Radiation Oncology | Admitting: Radiation Oncology

## 2013-06-10 NOTE — Progress Notes (Signed)
Provided patient with his 3rd and final case of Ensure Plus.

## 2013-06-11 ENCOUNTER — Telehealth: Payer: Self-pay | Admitting: Nurse Practitioner

## 2013-06-11 ENCOUNTER — Ambulatory Visit (HOSPITAL_BASED_OUTPATIENT_CLINIC_OR_DEPARTMENT_OTHER): Payer: BC Managed Care – PPO | Admitting: Nurse Practitioner

## 2013-06-11 ENCOUNTER — Ambulatory Visit
Admission: RE | Admit: 2013-06-11 | Discharge: 2013-06-11 | Disposition: A | Discharge status: Home / Self Care | Payer: BC Managed Care – PPO | Source: Ambulatory Visit | Attending: Radiation Oncology | Admitting: Radiation Oncology

## 2013-06-11 ENCOUNTER — Other Ambulatory Visit (HOSPITAL_BASED_OUTPATIENT_CLINIC_OR_DEPARTMENT_OTHER): Payer: BC Managed Care – PPO

## 2013-06-11 ENCOUNTER — Ambulatory Visit: Payer: BC Managed Care – PPO

## 2013-06-11 VITALS — BP 156/96 | HR 123 | Temp 97.2°F | Resp 19 | Ht 68.0 in | Wt 123.9 lb

## 2013-06-11 DIAGNOSIS — I10 Essential (primary) hypertension: Secondary | ICD-10-CM

## 2013-06-11 DIAGNOSIS — N509 Disorder of male genital organs, unspecified: Secondary | ICD-10-CM

## 2013-06-11 DIAGNOSIS — C20 Malignant neoplasm of rectum: Secondary | ICD-10-CM

## 2013-06-11 DIAGNOSIS — D509 Iron deficiency anemia, unspecified: Secondary | ICD-10-CM

## 2013-06-11 DIAGNOSIS — R197 Diarrhea, unspecified: Secondary | ICD-10-CM

## 2013-06-11 LAB — COMPREHENSIVE METABOLIC PANEL (CC13)
ALT: 18 U/L (ref 0–55)
AST: 16 U/L (ref 5–34)
Albumin: 2.1 g/dL — ABNORMAL LOW (ref 3.5–5.0)
Alkaline Phosphatase: 57 U/L (ref 40–150)
Anion Gap: 10 mEq/L (ref 3–11)
BUN: 30.6 mg/dL — ABNORMAL HIGH (ref 7.0–26.0)
CO2: 21 mEq/L — ABNORMAL LOW (ref 22–29)
Calcium: 8.7 mg/dL (ref 8.4–10.4)
Chloride: 97 mEq/L — ABNORMAL LOW (ref 98–109)
Potassium: 3.4 mEq/L — ABNORMAL LOW (ref 3.5–5.1)
Sodium: 128 mEq/L — ABNORMAL LOW (ref 136–145)
Total Protein: 5.8 g/dL — ABNORMAL LOW (ref 6.4–8.3)

## 2013-06-11 LAB — CBC WITH DIFFERENTIAL/PLATELET
BASO%: 0.3 % (ref 0.0–2.0)
Basophils Absolute: 0 10*3/uL (ref 0.0–0.1)
EOS%: 0.1 % (ref 0.0–7.0)
HCT: 35.3 % — ABNORMAL LOW (ref 38.4–49.9)
HGB: 11.6 g/dL — ABNORMAL LOW (ref 13.0–17.1)
MCH: 27.7 pg (ref 27.2–33.4)
MCHC: 32.8 g/dL (ref 32.0–36.0)
MONO#: 0.2 10*3/uL (ref 0.1–0.9)
RDW: 19.9 % — ABNORMAL HIGH (ref 11.0–14.6)
WBC: 9 10*3/uL (ref 4.0–10.3)
lymph#: 0.1 10*3/uL — ABNORMAL LOW (ref 0.9–3.3)

## 2013-06-11 NOTE — Telephone Encounter (Signed)
MW gone for day and IVF needs to be put on for 12/17 and 18 promised pt I would call him early tomorrow to let him know what time to be here today shh

## 2013-06-11 NOTE — Progress Notes (Addendum)
OFFICE PROGRESS NOTE  Interval history:  Mr. Altadonna returns for followup of rectal cancer. He resumed Xeloda this morning. He feels "weaker". He thinks his hands are "a little better". Hands and feet continue to be dry and painful. He is having diarrhea. He denies rectal bleeding. He continues oxycodone for pain at the rectum and scrotum. Appetite is poor. He feels he is taking in adequate fluids. He denies any mouth sores.   Objective: Blood pressure 156/96, pulse 123, temperature 97.2 F (36.2 C), temperature source Oral, resp. rate 19, height 5\' 8"  (1.727 m), weight 123 lb 14.4 oz (56.201 kg).  Oropharynx is without thrush or ulcerations. Lungs are clear. Heart is regular, tachycardic. Abdomen is soft and nontender. No hepatomegaly. No leg edema. Palms and soles are erythematous. Superficial desquamation over the scrotum, perineum and gluteal fold.  Lab Results: Lab Results  Component Value Date   WBC 9.0 06/11/2013   HGB 11.6* 06/11/2013   HCT 35.3* 06/11/2013   MCV 84.6 06/11/2013   PLT 355 06/11/2013    Chemistry:    Chemistry      Component Value Date/Time   NA 128* 06/11/2013 1550   NA 144 04/05/2013 0937   K 3.4* 06/11/2013 1550   K 3.6 04/05/2013 0937   CL 111 04/05/2013 0937   CO2 21* 06/11/2013 1550   CO2 24 04/05/2013 0937   BUN 30.6* 06/11/2013 1550   BUN 17 04/05/2013 0937   CREATININE 1.0 06/11/2013 1550   CREATININE 0.8 04/05/2013 0937      Component Value Date/Time   CALCIUM 8.7 06/11/2013 1550   CALCIUM 8.8 04/05/2013 0937   ALKPHOS 57 06/11/2013 1550   ALKPHOS 61 04/05/2013 0937   AST 16 06/11/2013 1550   AST 20 04/05/2013 0937   ALT 18 06/11/2013 1550   ALT 20 04/05/2013 0937   BILITOT 0.91 06/11/2013 1550   BILITOT 0.5 04/05/2013 0937       Studies/Results: No results found.  Medications: I have reviewed the patient's current medications.  Assessment/Plan:  1. Rectal cancer, clinical stage IV, presenting with a rectal mass, perirectal  lymphadenopathy and a hypermetabolic liver lesion. Initiation of concurrent Xeloda and radiation on 05/06/2013. Xeloda placed on hold beginning 06/06/2013 due to diarrhea, hand-foot syndrome and skin breakdown at the scrotum/perineum. 2. Pain/constipation/rectal bleeding secondary to #1. Improvement in the "rectal" pain since beginning treatment 3. History of hypertension. 4. 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 5. Anemia. Iron deficiency. 6. Skin breakdown at the scrotum and perineum. 7. "Diarrhea". 8. Oral candidiasis 06/06/2013. He completed a course of Diflucan. 9. Weight loss. He continues to lose weight.  Disposition-he continues radiation. He resumed Xeloda earlier today. He continues to have diarrhea, symptoms of hand-foot syndrome and skin breakdown at the scrotum/perineum. We instructed him to discontinue Xeloda. We will arrange for him to have IV fluids on 06/12/2013 and 06/13/2013. He will continue OxyContin with oxycodone as needed for pain.  We are referring him for restaging CT scans in 2-3 weeks. We will see him in followup on 07/02/2012. He understands to contact the office in the interim with any problems.  Patient seen with Dr. Truett Perna.  25 minutes were spent face-to-face at today's visit with the majority of that time involved in counseling/coordination of care.  Lonna Cobb ANP/GNP-BC  This was a shared visit with Lonna Cobb. He continues to have pain related to skin breakdown at the scrotum and perineum. He is losing weight. We discontinued the Xeloda and he will receive intravenous  fluids over the next few days. I will discuss the case with Dr. Mitzi Hansen regarding the indication for placing radiation on hold.  Oetken will be scheduled for an office visit after restaging CT scans.  Mancel Bale, M.D.

## 2013-06-12 ENCOUNTER — Telehealth: Payer: Self-pay | Admitting: Oncology

## 2013-06-12 ENCOUNTER — Ambulatory Visit
Admission: RE | Admit: 2013-06-12 | Discharge: 2013-06-12 | Disposition: A | Discharge status: Home / Self Care | Payer: BC Managed Care – PPO | Source: Ambulatory Visit | Attending: Radiation Oncology | Admitting: Radiation Oncology

## 2013-06-12 ENCOUNTER — Telehealth: Payer: Self-pay | Admitting: *Deleted

## 2013-06-12 ENCOUNTER — Ambulatory Visit (HOSPITAL_BASED_OUTPATIENT_CLINIC_OR_DEPARTMENT_OTHER): Payer: BC Managed Care – PPO

## 2013-06-12 ENCOUNTER — Other Ambulatory Visit: Payer: Self-pay | Admitting: *Deleted

## 2013-06-12 ENCOUNTER — Ambulatory Visit: Payer: BC Managed Care – PPO

## 2013-06-12 ENCOUNTER — Other Ambulatory Visit: Payer: Self-pay | Admitting: Nurse Practitioner

## 2013-06-12 VITALS — BP 158/94 | HR 105 | Temp 98.5°F | Resp 20

## 2013-06-12 DIAGNOSIS — C20 Malignant neoplasm of rectum: Secondary | ICD-10-CM

## 2013-06-12 DIAGNOSIS — R197 Diarrhea, unspecified: Secondary | ICD-10-CM

## 2013-06-12 DIAGNOSIS — R634 Abnormal weight loss: Secondary | ICD-10-CM

## 2013-06-12 MED ORDER — SODIUM CHLORIDE 0.9 % IV SOLN
Freq: Once | INTRAVENOUS | Status: AC
  Administered 2013-06-12: 16:00:00 via INTRAVENOUS
  Filled 2013-06-12: qty 1000

## 2013-06-12 NOTE — Progress Notes (Signed)
  Radiation Oncology         [phone]) [phone] ________________________________  Name: Brendan Mosley MRN: [id number]  Date: 06/12/2013  DOB: 01-14-1951  Simulation Verification Note   NARRATIVE: The patient was brought to the treatment unit and placed in the planned treatment position for the patient's boost treatment. The clinical setup was verified. Then port films were obtained and uploaded to the radiation oncology medical record software.  The treatment beams were carefully compared against the planned radiation fields. The position, location, and shape of the radiation fields was reviewed. The targeted volume of tissue appears to be appropriately covered by the radiation beams. Based on my personal review, I approved the simulation verification. The patient's treatment will proceed as planned.  ________________________________   Radene Gunning, MD, PhD

## 2013-06-12 NOTE — Progress Notes (Signed)
Patient ambulated well; no signs of dizziness noted; post vital signs normal.

## 2013-06-12 NOTE — Telephone Encounter (Signed)
Per staff message and POF I have scheduled appts.  JMW  

## 2013-06-12 NOTE — Telephone Encounter (Signed)
called pt and adv of 12/17 and 12/18 IVF appt time [facility] shh

## 2013-06-12 NOTE — Telephone Encounter (Signed)
[facility] adv change of time for lab on 06/28/13 to coordinate w CT appt shh

## 2013-06-12 NOTE — Patient Instructions (Signed)
Dehydration, Elderly  Dehydration means your body does not have as much fluid as it needs. Your kidneys, brain, and heart will not work properly without the right amount of fluids and salt. Older adults are more likely to become dehydrated than younger adults. This is because:    Their bodies do not hold water as well.   Their bodies do not respond to temperature as well.   They do not get thirsty as easily or as quickly.  HOME CARE   Ask your doctor how to replace body fluid losses (rehydrate).   Drink enough fluids to keep your pee (urine) clear or pale yellow.   Drink small amounts of fluids often if you feel sick to your stomach (nauseous) or throw up (vomit).   Eat like you normally do.   Avoid:   Foods or drinks high in sugar.   Bubbly (carbonated) drinks.   Juice.   Very hot or cold fluids.   Drinks with caffeine.   Fatty, greasy foods.   Alcohol.   Tobacco.   Eating too much.   Gelatin desserts.   Wash your hands to avoid spreading germs (bacteria, viruses).   Only take medicine as told by your doctor.   Keep all doctor visits as told.  GET HELP RIGHT AWAY IF:    You cannot drink fluid without throwing up.   You get worse even with treatment.   Your vomit has blood in it or looks greenish.   Your poop (stool) has blood in it or looks black and tarry.   You have not peed in 6 to 8 hours.   You pee a small amount of very dark pee.   You have a fever.   You pass out (faint).   You have belly (abdominal) pain that gets worse or stays in one spot (localizes).   You have a rash, stiff neck, or bad headache.   You get easily annoyed, sleepy, or are hard to wake up.   You feel weak, dizzy, or very thirsty.  MAKE SURE YOU:    Understand these instructions.   Will watch your condition.   Will get help right away if you are not doing well or get worse.  Document Released: 06/02/2011 Document Revised: 09/05/2011 Document Reviewed: 06/02/2011  Exit[employer] Patient Information 2014  [employer]

## 2013-06-13 ENCOUNTER — Telehealth: Payer: Self-pay | Admitting: Oncology

## 2013-06-13 ENCOUNTER — Ambulatory Visit
Admission: RE | Admit: 2013-06-13 | Discharge: 2013-06-13 | Disposition: A | Discharge status: Home / Self Care | Payer: BC Managed Care – PPO | Source: Ambulatory Visit | Attending: Radiation Oncology | Admitting: Radiation Oncology

## 2013-06-13 ENCOUNTER — Ambulatory Visit: Payer: BC Managed Care – PPO

## 2013-06-13 ENCOUNTER — Other Ambulatory Visit: Payer: Self-pay | Admitting: *Deleted

## 2013-06-13 ENCOUNTER — Ambulatory Visit (HOSPITAL_BASED_OUTPATIENT_CLINIC_OR_DEPARTMENT_OTHER): Payer: BC Managed Care – PPO

## 2013-06-13 VITALS — BP 151/81 | HR 112 | Temp 98.0°F | Resp 20

## 2013-06-13 DIAGNOSIS — R634 Abnormal weight loss: Secondary | ICD-10-CM

## 2013-06-13 DIAGNOSIS — C20 Malignant neoplasm of rectum: Secondary | ICD-10-CM

## 2013-06-13 DIAGNOSIS — R197 Diarrhea, unspecified: Secondary | ICD-10-CM

## 2013-06-13 MED ORDER — DIPHENOXYLATE-ATROPINE 2.5-0.025 MG PO TABS: 2.0000 | ORAL_TABLET | Freq: Four times a day (QID) | ORAL | Status: DC | PRN

## 2013-06-13 MED ORDER — SODIUM CHLORIDE 0.9 % IV SOLN
Freq: Once | INTRAVENOUS | Status: AC
  Administered 2013-06-13: 11:00:00 via INTRAVENOUS
  Filled 2013-06-13: qty 1000

## 2013-06-13 NOTE — Telephone Encounter (Signed)
added lb for today - inf for tomorrow and lb/fu 12/22. pt given schedule early this morning.

## 2013-06-13 NOTE — Progress Notes (Signed)
Patient reports he is having up to 10 diarrhea episodes per day with no relief from immodium although he is taking it as prescribed. Reported this to Lonna Cobb, NP. NP to call in rx for lomotil to patient's requested pharmacy. Patient verbalizes understanding.   Patient c/o "having problems with" eating and drinking; pt has to swallow multiple times to get food down and he often throws up. He reports "food stays in my chest" and points to mid sternum. He states it is "stressful" to eat and drink. Dr. Truett Perna and Lonna Cobb, NP notified. Per providers, patient to push fluids over the weekend and monitor the way he feels when he eats and drinks. Patient to have clinic visit with APP on Monday. Patient to eat soft diet. The patient has been made aware of this and verbalizes understanding

## 2013-06-13 NOTE — Patient Instructions (Signed)
Dehydration, Adult Dehydration is when you lose more fluids from the body than you take in. Vital organs like the kidneys, brain, and heart cannot function without a proper amount of fluids and salt. Any loss of fluids from the body can cause dehydration.  CAUSES   Vomiting.  Diarrhea.  Excessive sweating.  Excessive urine output.  Fever. SYMPTOMS  Mild dehydration  Thirst.  Dry lips.  Slightly dry mouth. Moderate dehydration  Very dry mouth.  Sunken eyes.  Skin does not bounce back quickly when lightly pinched and released.  Dark urine and decreased urine production.  Decreased tear production.  Headache. Severe dehydration  Very dry mouth.  Extreme thirst.  Rapid, weak pulse (more than 100 beats per minute at rest).  Cold hands and feet.  Not able to sweat in spite of heat and temperature.  Rapid breathing.  Blue lips.  Confusion and lethargy.  Difficulty being awakened.  Minimal urine production.  No tears. DIAGNOSIS  Your caregiver will diagnose dehydration based on your symptoms and your exam. Blood and urine tests will help confirm the diagnosis. The diagnostic evaluation should also identify the cause of dehydration. TREATMENT  Treatment of mild or moderate dehydration can often be done at home by increasing the amount of fluids that you drink. It is best to drink small amounts of fluid more often. Drinking too much at one time can make vomiting worse. Refer to the home care instructions below. Severe dehydration needs to be treated at the hospital where you will probably be given intravenous (IV) fluids that contain water and electrolytes. HOME CARE INSTRUCTIONS   Ask your caregiver about specific rehydration instructions.  Drink enough fluids to keep your urine clear or pale yellow.  Drink small amounts frequently if you have nausea and vomiting.  Eat as you normally do.  Avoid:  Foods or drinks high in sugar.  Carbonated  drinks.  Juice.  Extremely hot or cold fluids.  Drinks with caffeine.  Fatty, greasy foods.  Alcohol.  Tobacco.  Overeating.  Gelatin desserts.  Wash your hands well to avoid spreading bacteria and viruses.  Only take over-the-counter or prescription medicines for pain, discomfort, or fever as directed by your caregiver.  Ask your caregiver if you should continue all prescribed and over-the-counter medicines.  Keep all follow-up appointments with your caregiver. SEEK MEDICAL CARE IF:  You have abdominal pain and it increases or stays in one area (localizes).  You have a rash, stiff neck, or severe headache.  You are irritable, sleepy, or difficult to awaken.  You are weak, dizzy, or extremely thirsty. SEEK IMMEDIATE MEDICAL CARE IF:   You are unable to keep fluids down or you get worse despite treatment.  You have frequent episodes of vomiting or diarrhea.  You have blood or green matter (bile) in your vomit.  You have blood in your stool or your stool looks black and tarry.  You have not urinated in 6 to 8 hours, or you have only urinated a small amount of very dark urine.  You have a fever.  You faint. MAKE SURE YOU:   Understand these instructions.  Will watch your condition.  Will get help right away if you are not doing well or get worse. Document Released: 06/13/2005 Document Revised: 09/05/2011 Document Reviewed: 01/31/2011 [employer] Patient Information 2014 [employer].  

## 2013-06-14 ENCOUNTER — Ambulatory Visit: Payer: BC Managed Care – PPO

## 2013-06-14 ENCOUNTER — Encounter: Payer: Self-pay | Admitting: Radiation Oncology

## 2013-06-14 ENCOUNTER — Encounter: Payer: Self-pay | Admitting: *Deleted

## 2013-06-14 ENCOUNTER — Ambulatory Visit
Admission: RE | Admit: 2013-06-14 | Discharge: 2013-06-14 | Disposition: A | Discharge status: Home / Self Care | Payer: BC Managed Care – PPO | Source: Ambulatory Visit | Attending: Radiation Oncology | Admitting: Radiation Oncology

## 2013-06-14 ENCOUNTER — Telehealth: Payer: Self-pay | Admitting: *Deleted

## 2013-06-14 ENCOUNTER — Ambulatory Visit (HOSPITAL_BASED_OUTPATIENT_CLINIC_OR_DEPARTMENT_OTHER): Payer: BC Managed Care – PPO

## 2013-06-14 VITALS — BP 141/83 | HR 109 | Temp 97.7°F | Resp 20

## 2013-06-14 DIAGNOSIS — C20 Malignant neoplasm of rectum: Secondary | ICD-10-CM

## 2013-06-14 DIAGNOSIS — E876 Hypokalemia: Secondary | ICD-10-CM

## 2013-06-14 MED ORDER — SODIUM CHLORIDE 0.9 % IV SOLN
Freq: Once | INTRAVENOUS | Status: AC
  Administered 2013-06-14: 13:00:00 via INTRAVENOUS
  Filled 2013-06-14: qty 1000

## 2013-06-14 NOTE — Progress Notes (Signed)
Rad tx s completed 28/28, rectum, had IVF's up in med onc today, patient weak and fatigued, bowel movement diarrhea still started lomotil yesterday, patient stated not working as yet., Pain 8-10 on 10 scale, last pain medication taken last night,aware to stop at social workrs desk after rad txs in w/c,  4:30 PM

## 2013-06-14 NOTE — Patient Instructions (Signed)

## 2013-06-14 NOTE — Telephone Encounter (Signed)
Faxed note to [facility] informing pharmacy pt has discontinued Xeloda.

## 2013-06-14 NOTE — Progress Notes (Signed)
[facility] Clinical Social Work  Clinical Social Work met briefly with Pt in chemo room. Pt made aware his items were available to be picked up from CSW and he plans to come by after radiation. Pt did not come by and CSW will inform others that his items are still here to be picked up. Kathrin Penner, MSW, LCSW   Doreen Salvage, LCSW Clinical Social Worker [facility] Center for Patient & Family Support [facility] Wednesday, Thursday and Friday Phone: [phone] Fax: [phone]

## 2013-06-14 NOTE — Progress Notes (Signed)
   Department of Radiation Oncology  Phone:  [phone] Fax:        [phone]  Weekly Treatment Note    Name: Brendan Mosley Date: 06/14/2013 MRN: [id number] DOB: 14-Nov-1950   Current dose: 50.4 Gy  Current fraction: 28   MEDICATIONS: Current Outpatient Prescriptions  Medication Sig Dispense Refill  . capecitabine (XELODA) 500 MG tablet Take 3 tablets (1,500 mg total) by mouth 2 (two) times daily after a meal. Take on days of radiation only (Mon- Fri)  168 tablet  0  . diphenoxylate-atropine (LOMOTIL) 2.5-0.025 MG per tablet Take 2 tablets by mouth 4 (four) times daily as needed for diarrhea or loose stools.  30 tablet  1  . docusate sodium (COLACE) 100 MG capsule Take 1 capsule (100 mg total) by mouth 2 (two) times daily.  60 capsule  0  . ferrous sulfate 325 (65 FE) MG EC tablet TAKE ONE TABLET TWICE A DAY  90 tablet  0  . fluconazole (DIFLUCAN) 100 MG tablet Take 1 tablet (100 mg total) by mouth daily.  5 tablet  0  . lidocaine (XYLOCAINE JELLY) 2 % jelly Apply 1 application topically as needed.  30 mL  1  . loperamide (IMODIUM) 2 MG capsule Take 1 capsule (2 mg total) by mouth as needed for diarrhea or loose stools. Take up to 8 tablets/ day  60 capsule  6  . OxyCODONE (OXYCONTIN) 15 mg T12A 12 hr tablet Take 1 tablet (15 mg total) by mouth every 12 (twelve) hours.  60 tablet  0  . oxyCODONE-acetaminophen (PERCOCET) 7.5-325 MG per tablet Take 1-2 tablets by mouth every 4 (four) hours as needed for pain. Do not take more than 10 tablets per day.  60 tablet  0  . silver sulfADIAZINE (SILVADENE) 1 % cream Apply 1 application topically daily.       No current facility-administered medications for this encounter.     ALLERGIES: Review of patient's allergies indicates no known allergies.   LABORATORY DATA:  Lab Results  Component Value Date   WBC 9.0 06/11/2013   HGB 11.6* 06/11/2013   HCT 35.3* 06/11/2013   MCV 84.6 06/11/2013   PLT 355 06/11/2013   Lab Results    Component Value Date   NA 128* 06/11/2013   K 3.4* 06/11/2013   CL 111 04/05/2013   CO2 21* 06/11/2013   Lab Results  Component Value Date   ALT 18 06/11/2013   AST 16 06/11/2013   ALKPHOS 57 06/11/2013   BILITOT 0.91 06/11/2013     NARRATIVE: Brendan Mosley was seen today for weekly treatment management. The chart was checked and the patient's films were reviewed. The patient finished his final fraction today. He began using Lomotil for diarrhea. Continued skin irritation and he is taking pain medicine for this.  PHYSICAL EXAMINATION:  Alert, in no acute distress. The patient is sitting in a wheelchair. Temperature 97.7 blood pressure 141/83 pulse 109 respiratory rate 20  ASSESSMENT: The patient  did satisfactorily with treatment.  PLAN: The patient will followup with me in 1 month. I expect that the patient's feeling much better over the next one to 2 weeks.

## 2013-06-17 ENCOUNTER — Telehealth: Payer: Self-pay | Admitting: *Deleted

## 2013-06-17 ENCOUNTER — Ambulatory Visit: Payer: BC Managed Care – PPO | Admitting: Nurse Practitioner

## 2013-06-17 ENCOUNTER — Other Ambulatory Visit: Payer: BC Managed Care – PPO

## 2013-06-17 ENCOUNTER — Ambulatory Visit: Payer: BC Managed Care – PPO

## 2013-06-17 NOTE — Telephone Encounter (Signed)
Called patient to follow-up on missed appointment. Left message for patient to call back to reschedule.

## 2013-06-17 NOTE — Telephone Encounter (Signed)
Patient did not show for today's appointment.  Left voice message at contact number to please call back and re-schedule appointment.

## 2013-06-17 NOTE — Progress Notes (Signed)
  Radiation Oncology         [phone]) [phone] ________________________________  Name: Brendan Mosley MRN: [id number]  Date: 06/14/2013  DOB: 1950-06-30  End of Treatment Note  Diagnosis:   Rectal cancer     Indication for treatment:  Palliative       Radiation treatment dates:   05/06/2013 through 06/14/2013  Site/dose:   The patient was treated to the rectal tumor and nearby high-risk regions within the pelvis. He initially was treated to the pelvis using a 4 field 3-D conformal technique. The patient then received a boost treatment using a coned-down 4 field technique. This delivered an additional 5.4 gray. The patient's total dose was 50.4 gray.  Narrative: The patient tolerated radiation treatment relatively well.   The patient had some significant skin irritation in the anal and scrotal region. The patient's treatment set up was altered during his course of treatment to try to decrease the amount of skin irritation which was present. Various skin creams were also utilized to help manage this issue.  Plan: The patient has completed radiation treatment. The patient will return to radiation oncology clinic for routine followup in one month. I advised the patient to call or return sooner if they have any questions or concerns related to their recovery or treatment. ________________________________  Radene Gunning, M.D., Ph.D.

## 2013-06-18 ENCOUNTER — Ambulatory Visit: Payer: BC Managed Care – PPO

## 2013-06-19 ENCOUNTER — Encounter: Payer: Self-pay | Admitting: *Deleted

## 2013-06-19 NOTE — Progress Notes (Signed)
[facility] Clinical Social Work  Clinical Social Work was referred by patient navigator for assessment of psychosocial needs due to not coming to his appointment on 06/17/13.  Clinical Social Worker attempted to contact patient at home to offer support and assess for needs, CSW left vm for Pt to return call.     Doreen Salvage, LCSW Clinical Social Worker [facility] Wednesday, Thursday and Friday Phone: [phone] Fax: [phone]

## 2013-06-20 ENCOUNTER — Inpatient Hospital Stay (HOSPITAL_COMMUNITY)
Admission: EM | Admit: 2013-06-20 | Discharge: 2013-06-26 | DRG: 934 | Disposition: A | Discharge status: Home Health | Payer: BC Managed Care – PPO | Attending: Internal Medicine | Admitting: Internal Medicine

## 2013-06-20 ENCOUNTER — Emergency Department (HOSPITAL_COMMUNITY): Payer: BC Managed Care – PPO

## 2013-06-20 DIAGNOSIS — I10 Essential (primary) hypertension: Secondary | ICD-10-CM | POA: Diagnosis present

## 2013-06-20 DIAGNOSIS — L270 Generalized skin eruption due to drugs and medicaments taken internally: Secondary | ICD-10-CM | POA: Diagnosis present

## 2013-06-20 DIAGNOSIS — Z794 Long term (current) use of insulin: Secondary | ICD-10-CM

## 2013-06-20 DIAGNOSIS — C801 Malignant (primary) neoplasm, unspecified: Secondary | ICD-10-CM

## 2013-06-20 DIAGNOSIS — K5900 Constipation, unspecified: Secondary | ICD-10-CM | POA: Diagnosis present

## 2013-06-20 DIAGNOSIS — E236 Other disorders of pituitary gland: Secondary | ICD-10-CM | POA: Diagnosis present

## 2013-06-20 DIAGNOSIS — C20 Malignant neoplasm of rectum: Secondary | ICD-10-CM | POA: Diagnosis present

## 2013-06-20 DIAGNOSIS — T310 Burns involving less than 10% of body surface: Secondary | ICD-10-CM | POA: Diagnosis present

## 2013-06-20 DIAGNOSIS — R599 Enlarged lymph nodes, unspecified: Secondary | ICD-10-CM | POA: Diagnosis present

## 2013-06-20 DIAGNOSIS — E43 Unspecified severe protein-calorie malnutrition: Secondary | ICD-10-CM | POA: Diagnosis present

## 2013-06-20 DIAGNOSIS — T300 Burn of unspecified body region, unspecified degree: Secondary | ICD-10-CM

## 2013-06-20 DIAGNOSIS — R197 Diarrhea, unspecified: Secondary | ICD-10-CM

## 2013-06-20 DIAGNOSIS — D509 Iron deficiency anemia, unspecified: Secondary | ICD-10-CM | POA: Diagnosis present

## 2013-06-20 DIAGNOSIS — E871 Hypo-osmolality and hyponatremia: Secondary | ICD-10-CM | POA: Diagnosis present

## 2013-06-20 DIAGNOSIS — R82998 Other abnormal findings in urine: Secondary | ICD-10-CM | POA: Diagnosis present

## 2013-06-20 DIAGNOSIS — L589 Radiodermatitis, unspecified: Secondary | ICD-10-CM | POA: Diagnosis present

## 2013-06-20 DIAGNOSIS — E860 Dehydration: Secondary | ICD-10-CM | POA: Diagnosis present

## 2013-06-20 DIAGNOSIS — R6889 Other general symptoms and signs: Secondary | ICD-10-CM

## 2013-06-20 DIAGNOSIS — E876 Hypokalemia: Secondary | ICD-10-CM | POA: Diagnosis present

## 2013-06-20 DIAGNOSIS — Y842 Radiological procedure and radiotherapy as the cause of abnormal reaction of the patient, or of later complication, without mention of misadventure at the time of the procedure: Secondary | ICD-10-CM | POA: Diagnosis present

## 2013-06-20 DIAGNOSIS — R5381 Other malaise: Secondary | ICD-10-CM | POA: Diagnosis present

## 2013-06-20 DIAGNOSIS — K7689 Other specified diseases of liver: Secondary | ICD-10-CM | POA: Diagnosis present

## 2013-06-20 DIAGNOSIS — Z8249 Family history of ischemic heart disease and other diseases of the circulatory system: Secondary | ICD-10-CM

## 2013-06-20 DIAGNOSIS — E872 Acidosis: Secondary | ICD-10-CM

## 2013-06-20 DIAGNOSIS — IMO0002 Reserved for concepts with insufficient information to code with codable children: Principal | ICD-10-CM | POA: Diagnosis present

## 2013-06-20 DIAGNOSIS — R300 Dysuria: Secondary | ICD-10-CM | POA: Diagnosis present

## 2013-06-20 DIAGNOSIS — K625 Hemorrhage of anus and rectum: Secondary | ICD-10-CM

## 2013-06-20 DIAGNOSIS — R32 Unspecified urinary incontinence: Secondary | ICD-10-CM | POA: Diagnosis present

## 2013-06-20 DIAGNOSIS — T451X5A Adverse effect of antineoplastic and immunosuppressive drugs, initial encounter: Secondary | ICD-10-CM | POA: Diagnosis present

## 2013-06-20 DIAGNOSIS — Z681 Body mass index (BMI) 19 or less, adult: Secondary | ICD-10-CM

## 2013-06-20 DIAGNOSIS — R4182 Altered mental status, unspecified: Secondary | ICD-10-CM | POA: Diagnosis present

## 2013-06-20 LAB — COMPREHENSIVE METABOLIC PANEL
AST: 30 U/L (ref 0–37)
Albumin: 2.4 g/dL — ABNORMAL LOW (ref 3.5–5.2)
Alkaline Phosphatase: 91 U/L (ref 39–117)
BUN: 30 mg/dL — ABNORMAL HIGH (ref 6–23)
CO2: 24 mEq/L (ref 19–32)
Chloride: 82 mEq/L — ABNORMAL LOW (ref 96–112)
Creatinine, Ser: 1.13 mg/dL (ref 0.50–1.35)
GFR calc Af Amer: 78 mL/min — ABNORMAL LOW (ref >90)
GFR calc non Af Amer: 67 mL/min — ABNORMAL LOW (ref >90)
Potassium: 2.2 mEq/L — CL (ref 3.5–5.1)
Total Bilirubin: 1.1 mg/dL (ref 0.3–1.2)
Total Protein: 6.5 g/dL (ref 6.0–8.3)

## 2013-06-20 LAB — CBC WITH DIFFERENTIAL/PLATELET
Basophils Absolute: 0 10*3/uL (ref 0.0–0.1)
Eosinophils Absolute: 0.3 10*3/uL (ref 0.0–0.7)
Eosinophils Relative: 5 % (ref 0–5)
HCT: 40.4 % (ref 39.0–52.0)
Lymphocytes Relative: 3 % — ABNORMAL LOW (ref 12–46)
Lymphs Abs: 0.2 10*3/uL — ABNORMAL LOW (ref 0.7–4.0)
MCV: 81.5 fL (ref 78.0–100.0)
Monocytes Absolute: 0.2 10*3/uL (ref 0.1–1.0)
Monocytes Relative: 3 % (ref 3–12)
Neutro Abs: 6.2 10*3/uL (ref 1.7–7.7)
Neutrophils Relative %: 89 % — ABNORMAL HIGH (ref 43–77)
RBC: 4.96 MIL/uL (ref 4.22–5.81)
RDW: 19.6 % — ABNORMAL HIGH (ref 11.5–15.5)
WBC Morphology: INCREASED
WBC: 6.9 10*3/uL (ref 4.0–10.5)

## 2013-06-20 MED ORDER — POTASSIUM CHLORIDE 20 MEQ/15ML (10%) PO LIQD
80.0000 meq | Freq: Once | ORAL | Status: AC
  Administered 2013-06-21: 80 meq via ORAL
  Filled 2013-06-20: qty 60

## 2013-06-20 MED ORDER — POTASSIUM CHLORIDE 10 MEQ/100ML IV SOLN
10.0000 meq | INTRAVENOUS | Status: AC
  Administered 2013-06-21 (×4): 10 meq via INTRAVENOUS
  Filled 2013-06-20 (×4): qty 100

## 2013-06-20 MED ORDER — SODIUM CHLORIDE 0.9 % IV SOLN
INTRAVENOUS | Status: DC
  Administered 2013-06-21: via INTRAVENOUS
  Administered 2013-06-21: 500 mL via INTRAVENOUS
  Administered 2013-06-21 (×2): 150 mL/h via INTRAVENOUS
  Administered 2013-06-22 (×2): via INTRAVENOUS
  Administered 2013-06-22 (×2): 1000 mL via INTRAVENOUS
  Administered 2013-06-23: 04:00:00 via INTRAVENOUS

## 2013-06-20 MED ORDER — POTASSIUM CHLORIDE 10 MEQ/100ML IV SOLN
10.0000 meq | Freq: Once | INTRAVENOUS | Status: AC
  Administered 2013-06-21: 10 meq via INTRAVENOUS
  Filled 2013-06-20: qty 100

## 2013-06-20 MED ORDER — SODIUM CHLORIDE 0.9 % IV BOLUS (SEPSIS)
1000.0000 mL | Freq: Once | INTRAVENOUS | Status: AC
  Administered 2013-06-20: 1000 mL via INTRAVENOUS

## 2013-06-20 MED ORDER — SODIUM CHLORIDE 0.9 % IV BOLUS (SEPSIS)
1000.0000 mL | Freq: Once | INTRAVENOUS | Status: AC
  Administered 2013-06-21: 1000 mL via INTRAVENOUS

## 2013-06-20 NOTE — ED Notes (Signed)
Patient transported to CT 

## 2013-06-20 NOTE — ED Provider Notes (Signed)
CSN: [id number]     Arrival date & time 06/20/13  2100 History   First MD Initiated Contact with Patient 06/20/13 2104     Chief Complaint  Patient presents with  . Altered Mental Status    for past week  . Diarrhea    due to  colon cancer,  raw butt prior to arrival   (Consider location/radiation/quality/duration/timing/severity/associated sxs/prior Treatment) HPI  This is a 62 year old male with history of rectal cancer with last treatment on December 19 of radiation and chemotherapy who presents with generalized weakness and confusion. The patient was noted by girlfriend to have difficulty getting out of the bathtub earlier today. He was only oriented to himself upon EMS arrival. Patient's denies any recent fevers. He has had diarrhea. Patient himself is oriented to time and person but not place. He is difficult to obtain history from secondary to mental status.  Patient denies any shortness of breath or chest pain. They deny any fevers.  Past Medical History  Diagnosis Date  . Hemorrhoids   . ED (erectile dysfunction)   . Hypertension   . Cancer 04/05/13    rectal  . DM (diabetes mellitus)    No past surgical history on file. Family History  Problem Relation Age of Onset  . Hypertension Father    History  Substance Use Topics  . Smoking status: Never Smoker   . Smokeless tobacco: Never Used  . Alcohol Use: No    Review of Systems  Constitutional: Positive for appetite change. Negative for fever.  Respiratory: Negative.  Negative for chest tightness and shortness of breath.   Cardiovascular: Negative.  Negative for chest pain.  Gastrointestinal: Positive for diarrhea. Negative for vomiting and abdominal pain.  Genitourinary: Negative.  Negative for dysuria.  Musculoskeletal: Negative for back pain.  Skin: Positive for wound. Negative for rash.  Neurological: Positive for weakness. Negative for dizziness, numbness and headaches.  All other systems reviewed and are  negative.    Allergies  Review of patient's allergies indicates no known allergies.  Home Medications   Current Outpatient Rx  Name  Route  Sig  Dispense  Refill  . capecitabine (XELODA) 500 MG tablet   Oral   Take 1,500 mg by mouth 2 (two) times daily after a meal.         . diphenoxylate-atropine (LOMOTIL) 2.5-0.025 MG per tablet   Oral   Take 2 tablets by mouth 4 (four) times daily as needed for diarrhea or loose stools.         . ferrous sulfate 325 (65 FE) MG tablet   Oral   Take 325 mg by mouth 2 (two) times daily with a meal.         . fluconazole (DIFLUCAN) 100 MG tablet   Oral   Take 100 mg by mouth daily.         Marland Kitchen loperamide (IMODIUM) 2 MG capsule   Oral   Take 2 mg by mouth daily as needed for diarrhea or loose stools.         Marland Kitchen neomycin-bacitracin-polymyxin (NEOSPORIN) ointment   Topical   Apply 1 application topically 4 (four) times daily as needed for wound care. apply to eye         . OxyCODONE (OXYCONTIN) 15 mg T12A 12 hr tablet   Oral   Take 15 mg by mouth every 12 (twelve) hours.         Marland Kitchen oxyCODONE-acetaminophen (PERCOCET) 7.5-325 MG per tablet   Oral  Take 1-2 tablets by mouth every 4 (four) hours as needed for pain.         Marland Kitchen docusate sodium (COLACE) 100 MG capsule   Oral   Take 1 capsule (100 mg total) by mouth 2 (two) times daily.   60 capsule   0   . lidocaine (XYLOCAINE JELLY) 2 % jelly   Topical   Apply 1 application topically as needed.   30 mL   1   . silver sulfADIAZINE (SILVADENE) 1 % cream   Topical   Apply 1 application topically daily.          There were no vitals taken for this visit. Physical Exam  Nursing note and vitals reviewed. Constitutional:  Cachectic, chronically ill-appearing  HENT:  Head: Normocephalic and atraumatic.  Poor dentition, mucous membranes dry  Eyes: Pupils are equal, round, and reactive to light.  Neck: Neck supple.  Cardiovascular: Regular rhythm and normal heart  sounds.   No murmur heard. Tachycardia  Pulmonary/Chest: Effort normal and breath sounds normal. No respiratory distress. He has no wheezes.  Abdominal: Soft. Bowel sounds are normal. There is no tenderness. There is no rebound.  Musculoskeletal: He exhibits no edema.  Lymphadenopathy:    He has no cervical adenopathy.  Neurological: He is alert.  Oriented to person and time  Skin:  Large area of presumed radiation burn over the buttock and sacrum, no evidence of erythema or overlying infection  Psychiatric: He has a normal mood and affect.    ED Course  Procedures (including critical care time) Labs Review Labs Reviewed  CBC WITH DIFFERENTIAL - Abnormal; Notable for the following:    RDW 19.6 (*)    Neutrophils Relative % 89 (*)    Lymphocytes Relative 3 (*)    Lymphs Abs 0.2 (*)    All other components within normal limits  COMPREHENSIVE METABOLIC PANEL - Abnormal; Notable for the following:    Sodium 126 (*)    Potassium 2.2 (*)    Chloride 82 (*)    BUN 30 (*)    Calcium 8.2 (*)    Albumin 2.4 (*)    GFR calc non Af Amer 67 (*)    GFR calc Af Amer 78 (*)    All other components within normal limits  CG4 I-STAT (LACTIC ACID) - Abnormal; Notable for the following:    Lactic Acid, Venous 5.06 (*)    All other components within normal limits  CULTURE, BLOOD (ROUTINE X 2)  CULTURE, BLOOD (ROUTINE X 2)  URINE CULTURE  URINALYSIS, ROUTINE W REFLEX MICROSCOPIC   Imaging Review Ct Head Wo Contrast  06/20/2013   CLINICAL DATA:  Altered mental status.  EXAM: CT HEAD WITHOUT CONTRAST  TECHNIQUE: Contiguous axial images were obtained from the base of the skull through the vertex without intravenous contrast.  COMPARISON:  None.  FINDINGS: No mass lesion, mass effect, midline shift, hydrocephalus, hemorrhage. No territorial ischemia or acute infarction. Periventricular white matter disease is present, most likely chronic ischemic. Paranasal sinuses demonstrate a right maxillary  mucous retention cyst/ polyp. Mastoid air cells are clear. Scattered mucous retention cyst/ polyps in the right sphenoid and right ethmoid air cells.  IMPRESSION: No acute intracranial abnormality. 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   Electronically Signed   By: Andreas Newport M.D.   On: 06/20/2013 21:49    EKG Interpretation    Date/Time:  Thursday June 20 2013 23:09:07 EST Ventricular Rate:  93 PR Interval:  149  QRS Duration: 96 QT Interval:  459 QTC Calculation: 571 R Axis:   67 Text Interpretation:  Sinus rhythm Probable left atrial enlargement Left ventricular hypertrophy Prolonged QT interval no prior for comparison Confirmed by Sebastian Lurz  MD, Toni Amend ([id number]) on 06/20/2013 11:14:34 PM           CRITICAL CARE Performed by: Ross Marcus, F   Total critical care time: 35 min  Critical care time was exclusive of separately billable procedures and treating other patients.  Critical care was necessary to treat or prevent imminent or life-threatening deterioration.  Critical care was time spent personally by me on the following activities: development of treatment plan with patient and/or surrogate as well as nursing, discussions with consultants, evaluation of patient's response to treatment, examination of patient, obtaining history from patient or surrogate, ordering and performing treatments and interventions, ordering and review of laboratory studies, ordering and review of radiographic studies, pulse oximetry and re-evaluation of patient's condition.  MDM   1. Hypokalemia   2. Lactic acidosis   3. Dehydration    Patient with a history of rectal cancer. Presents with generalized weakness and altered mental status. He is cachectic and ill appearing on exam. He is afebrile.  Basic labwork was obtained including lactate. Lactic acid is 5. Patient was given normal saline bolus. Blood cultures were obtained given lactic acidosis. Laboratory  work up is notable for hyponatremia and hypokalemia to 2.2. EKG shows prolonged QT interval.  IV potassium and by mouth potassium was ordered.  Given hypokalemia with EKG changes, patient will be admitted for further supplementation, hydration, and further evaluation.    Shon Baton, MD 06/20/13 [phone]

## 2013-06-20 NOTE — ED Notes (Signed)
[facility] EDP given CG4 Lactic results. 

## 2013-06-20 NOTE — ED Notes (Signed)
Per EMS pt was on hands and knees trying to get out of tub,  EMS assisted him walking to livingroom then put on stretcher to ems truck,   Pt couldn't answer birthdate,  Date or any pertinent information

## 2013-06-20 NOTE — ED Notes (Signed)
Pt has excoriated buttocks, scrotum and penis ,  Dr Wilkie Aye pt  is emanciated,, pt also has skin peeling from hands,    Per girlfriend at bedside she helps to care for pt.  Pt states he still walks at home

## 2013-06-20 NOTE — ED Notes (Signed)
Bed: WA04 Expected date:  Expected time:  Means of arrival:  Comments: 62 yo M  Cancer pt, altered mental status

## 2013-06-20 NOTE — ED Notes (Signed)
cbg 103 pta,  Colon cancer treatment,  Diarrhea per girlfriend whom lives with him,  Took 2 hours to get out of bath tub,  20 gauge l. Forearm pta via ems.  Altered mental status for past week,

## 2013-06-21 ENCOUNTER — Encounter (HOSPITAL_COMMUNITY): Payer: Self-pay | Admitting: *Deleted

## 2013-06-21 DIAGNOSIS — E860 Dehydration: Secondary | ICD-10-CM | POA: Diagnosis present

## 2013-06-21 DIAGNOSIS — E876 Hypokalemia: Secondary | ICD-10-CM

## 2013-06-21 DIAGNOSIS — E871 Hypo-osmolality and hyponatremia: Secondary | ICD-10-CM

## 2013-06-21 DIAGNOSIS — W880XXA Exposure to X-rays, initial encounter: Secondary | ICD-10-CM

## 2013-06-21 DIAGNOSIS — T300 Burn of unspecified body region, unspecified degree: Secondary | ICD-10-CM

## 2013-06-21 LAB — URINALYSIS, ROUTINE W REFLEX MICROSCOPIC
Glucose, UA: NEGATIVE mg/dL
Ketones, ur: 15 mg/dL — AB
Protein, ur: 30 mg/dL — AB
Urobilinogen, UA: 1 mg/dL (ref 0.0–1.0)

## 2013-06-21 LAB — BASIC METABOLIC PANEL
Calcium: 7 mg/dL — ABNORMAL LOW (ref 8.4–10.5)
Chloride: 93 mEq/L — ABNORMAL LOW (ref 96–112)
Creatinine, Ser: 1.08 mg/dL (ref 0.50–1.35)
GFR calc Af Amer: 82 mL/min — ABNORMAL LOW (ref >90)
Glucose, Bld: 92 mg/dL (ref 70–99)

## 2013-06-21 LAB — CBC
MCH: 27.3 pg (ref 26.0–34.0)
MCV: 81.1 fL (ref 78.0–100.0)
Platelets: 277 10*3/uL (ref 150–400)
RDW: 19.7 % — ABNORMAL HIGH (ref 11.5–15.5)

## 2013-06-21 LAB — URINE MICROSCOPIC-ADD ON

## 2013-06-21 LAB — GLUCOSE, CAPILLARY
Glucose-Capillary: 93 mg/dL (ref 70–99)
Glucose-Capillary: 89 mg/dL (ref 70–99)

## 2013-06-21 LAB — LACTIC ACID, PLASMA: Lactic Acid, Venous: 1.1 mmol/L (ref 0.5–2.2)

## 2013-06-21 MED ORDER — BAG BALM OINTMENT
TOPICAL_OINTMENT | CUTANEOUS | Status: DC | PRN
  Filled 2013-06-21: qty 300

## 2013-06-21 MED ORDER — FLUCONAZOLE 100 MG PO TABS
100.0000 mg | ORAL_TABLET | Freq: Every day | ORAL | Status: DC
  Administered 2013-06-21 – 2013-06-26 (×6): 100 mg via ORAL
  Filled 2013-06-21 (×6): qty 1

## 2013-06-21 MED ORDER — BACITRACIN-NEOMYCIN-POLYMYXIN 400-5-5000 EX OINT: 1.0000 "application " | TOPICAL_OINTMENT | Freq: Four times a day (QID) | CUTANEOUS | Status: DC | PRN

## 2013-06-21 MED ORDER — FERROUS SULFATE 325 (65 FE) MG PO TABS
325.0000 mg | ORAL_TABLET | Freq: Two times a day (BID) | ORAL | Status: DC
  Administered 2013-06-21 – 2013-06-26 (×12): 325 mg via ORAL
  Filled 2013-06-21 (×14): qty 1

## 2013-06-21 MED ORDER — POTASSIUM CHLORIDE CRYS ER 20 MEQ PO TBCR
40.0000 meq | EXTENDED_RELEASE_TABLET | Freq: Once | ORAL | Status: AC
  Administered 2013-06-21: 40 meq via ORAL
  Filled 2013-06-21: qty 2

## 2013-06-21 MED ORDER — BIOTENE DRY MOUTH MT LIQD
15.0000 mL | Freq: Two times a day (BID) | OROMUCOSAL | Status: DC
  Administered 2013-06-21 – 2013-06-26 (×11): 15 mL via OROMUCOSAL

## 2013-06-21 MED ORDER — OXYCODONE HCL ER 15 MG PO T12A
15.0000 mg | EXTENDED_RELEASE_TABLET | Freq: Two times a day (BID) | ORAL | Status: DC
  Administered 2013-06-21 – 2013-06-26 (×12): 15 mg via ORAL
  Filled 2013-06-21 (×12): qty 1

## 2013-06-21 MED ORDER — BOOST / RESOURCE BREEZE PO LIQD
1.0000 | ORAL | Status: DC
  Administered 2013-06-21 – 2013-06-25 (×4): 1 via ORAL

## 2013-06-21 MED ORDER — OXYCODONE-ACETAMINOPHEN 7.5-325 MG PO TABS: 1.0000 | ORAL_TABLET | ORAL | Status: DC | PRN

## 2013-06-21 MED ORDER — DIPHENOXYLATE-ATROPINE 2.5-0.025 MG PO TABS
2.0000 | ORAL_TABLET | Freq: Four times a day (QID) | ORAL | Status: DC | PRN
  Administered 2013-06-21 – 2013-06-26 (×11): 2 via ORAL
  Filled 2013-06-21 (×6): qty 2
  Filled 2013-06-21: qty 1
  Filled 2013-06-21 (×2): qty 2
  Filled 2013-06-21 (×2): qty 1
  Filled 2013-06-21: qty 2
  Filled 2013-06-21: qty 1

## 2013-06-21 MED ORDER — SILVER SULFADIAZINE 1 % EX CREA
TOPICAL_CREAM | Freq: Every day | CUTANEOUS | Status: DC
  Administered 2013-06-21: 12:00:00 via TOPICAL
  Filled 2013-06-21: qty 50

## 2013-06-21 MED ORDER — LOPERAMIDE HCL 2 MG PO CAPS
2.0000 mg | ORAL_CAPSULE | Freq: Every day | ORAL | Status: DC | PRN
  Administered 2013-06-22 – 2013-06-25 (×7): 2 mg via ORAL
  Filled 2013-06-21 (×7): qty 1

## 2013-06-21 MED ORDER — HYDROMORPHONE HCL PF 1 MG/ML IJ SOLN
1.0000 mg | INTRAMUSCULAR | Status: DC | PRN
  Administered 2013-06-24 (×3): 1 mg via INTRAVENOUS
  Filled 2013-06-21 (×3): qty 1

## 2013-06-21 MED ORDER — CHLORHEXIDINE GLUCONATE 0.12 % MT SOLN
15.0000 mL | Freq: Two times a day (BID) | OROMUCOSAL | Status: DC
  Administered 2013-06-21 – 2013-06-26 (×11): 15 mL via OROMUCOSAL
  Filled 2013-06-21 (×13): qty 15

## 2013-06-21 MED ORDER — CAPECITABINE 500 MG PO TABS: 1500.0000 mg | ORAL_TABLET | Freq: Two times a day (BID) | ORAL | Status: DC

## 2013-06-21 MED ORDER — HEPARIN SODIUM (PORCINE) 5000 UNIT/ML IJ SOLN
5000.0000 [IU] | Freq: Three times a day (TID) | INTRAMUSCULAR | Status: DC
  Administered 2013-06-21 – 2013-06-26 (×16): 5000 [IU] via SUBCUTANEOUS
  Filled 2013-06-21 (×20): qty 1

## 2013-06-21 MED ORDER — DOCUSATE SODIUM 100 MG PO CAPS
100.0000 mg | ORAL_CAPSULE | Freq: Two times a day (BID) | ORAL | Status: DC
  Administered 2013-06-21: 100 mg via ORAL
  Filled 2013-06-21 (×4): qty 1

## 2013-06-21 MED ORDER — INSULIN ASPART 100 UNIT/ML ~~LOC~~ SOLN
0.0000 [IU] | Freq: Three times a day (TID) | SUBCUTANEOUS | Status: DC
  Administered 2013-06-23 – 2013-06-24 (×2): 1 [IU] via SUBCUTANEOUS

## 2013-06-21 MED ORDER — SILVER SULFADIAZINE 1 % EX CREA
1.0000 "application " | TOPICAL_CREAM | Freq: Every day | CUTANEOUS | Status: DC
  Filled 2013-06-21: qty 85

## 2013-06-21 MED ORDER — LIDOCAINE HCL 2 % EX GEL
1.0000 "application " | Freq: Once | CUTANEOUS | Status: AC
  Administered 2013-06-21: 1 via TOPICAL
  Filled 2013-06-21: qty 10

## 2013-06-21 NOTE — ED Notes (Signed)
Dr Beacher May paged, Re: Glucose Management

## 2013-06-21 NOTE — ED Notes (Signed)
Pt has soiled bed and brief.  Pt cleaned and linens changed.  Pt continues to have continual watery diarrhea during cleaning and after.  Clean sheets, gown, and brief applied.

## 2013-06-21 NOTE — ED Notes (Signed)
POCT CBG 93 

## 2013-06-21 NOTE — Progress Notes (Signed)
PHARMACY NOTE;  CAPECITABINE  Capecitabine (Xeloda) hold criteria from oral chemotherapy safety policy:  ANC < 1.5  Pltc < 100K  CrCl < 30 mL/min (contraindication)  SCr > 1.5x baseline (or >2 if baseline unknown)  Bilirubin > 1.5x ULN  Active infection  Acute coronary syndrome  Diarrhea - Grade 2 or higher  Hand / foot syndrome - Grade 2 or higher  Surgery planned this admission   Because patient was admitted with diarrhea and severe dehydration, the capecitabine order has been rejected per policy.  Recommend consulting oncology for further evaluation and inpatient orders, including determination of when capecitabine can be safely resumed.  Elie Goody, PharmD, BCPS Pager: [phone] 06/21/2013  6:17 AM

## 2013-06-21 NOTE — Progress Notes (Signed)
This nurse called Dr. Benjamine Mola re; patient's K level of 3.3 this am,she ordered K-dur po x1.- Hulda Marin RN

## 2013-06-21 NOTE — Progress Notes (Signed)
INITIAL NUTRITION ASSESSMENT  DOCUMENTATION CODES Per approved criteria  -Severe malnutrition in the context of chronic illness   INTERVENTION: Monitor magnesium, potassium, and phosphorus daily for at least 3 days, MD to replete as needed, as pt is at risk for refeeding syndrome given ongoing severe malnutrition. Recommend obtaining current weight on patient. Add Resource Breeze po daily, each supplement provides 250 kcal and 9 grams of protein. RD to continue to follow nutrition care plan.  NUTRITION DIAGNOSIS: Inadequate oral intake related to catabolic illness as evidenced by estimated needs.   Goal: Intake to meet >90% of estimated nutrition needs.  Monitor:  weight trends, lab trends, I/O's, PO intake, supplement tolerance  Reason for Assessment: Malnutrition Screening Tool  62 y.o. male  Admitting Dx: Ionizing radiation burn  ASSESSMENT: PMHx significant for rectal CA and DM; currently undergoing radiation and chemotherapy. Admitted with AMS. Work-up reveals dehydration.  WOC RN saw pt on 12/26 for radiation dermatitis and MASD.  Pt has been seen by oncology RD in the past few months and he has been given Ensure. Pt states that he does not want to receive Ensure while here.  Currently ordered for a Dysphagia 3 diet with thin liquids. Eating about 50% of meals.  Pt with 12% wt loss x 1 month. Pt appears cachectic.  Nutrition Focused Physical Exam:  Subcutaneous Fat:  Orbital Region: severe depletion Upper Arm Region: severe depletion Thoracic and Lumbar Region: severe depletion  Muscle:  Temple Region: severe depletion Clavicle Bone Region: severe depletion Clavicle and Acromion Bone Region: severe depletion Scapular Bone Region: severe depletion Dorsal Hand: severe depletion Patellar Region: severe depletion Anterior Thigh Region: severe depletion Posterior Calf Region: severe depletion  Edema: n/a  Pt meets criteria for severe MALNUTRITION in the  context of chronic illness as evidenced by 12% of weight loss x 1 month and severe fat and muscle mass loss.  Potassium is low at 2.2 at trending up, no phosphorus or magnesium is available.   Height: Ht Readings from Last 1 Encounters:  06/11/13 5\' 8"  (1.727 m)    Weight: Wt Readings from Last 1 Encounters:  06/11/13 123 lb 14.4 oz (56.201 kg)    Ideal Body Weight: 154 lb  % Ideal Body Weight: 80%  Wt Readings from Last 10 Encounters:  06/11/13 123 lb 14.4 oz (56.201 kg)  06/07/13 128 lb 4.8 oz (58.196 kg)  06/06/13 131 lb (59.421 kg)  05/30/13 139 lb 9.6 oz (63.322 kg)  05/27/13 135 lb 6.4 oz (61.417 kg)  05/20/13 141 lb 6.4 oz (64.139 kg)  05/17/13 147 lb 1.6 oz (66.724 kg)  05/14/13 144 lb 3.2 oz (65.409 kg)  05/10/13 140 lb 9.6 oz (63.776 kg)  04/30/13 142 lb 14.4 oz (64.819 kg)    Usual Body Weight: 140 lb  % Usual Body Weight: 88%  BMI:  18.7 - normal weight  Estimated Nutritional Needs: Kcal: 1680 - 1800 Protein: at least 84 grams daily Fluid: 1.7 - 2 liters  Skin:  radiation dermatitis and MASD to buttocks  Diet Order: Dysphagia 3; thin  EDUCATION NEEDS: -No education needs identified at this time   Intake/Output Summary (Last 24 hours) at 06/21/13 1515 Last data filed at 06/21/13 1111  Gross per 24 hour  Intake   1692 ml  Output    350 ml  Net   1342 ml    Last BM: 12/26  Labs:   Recent Labs Lab 06/20/13 2204 06/21/13 0655  NA 126* 127*  K 2.2*  3.3*  CL 82* 93*  CO2 24 22  BUN 30* 25*  CREATININE 1.13 1.08  CALCIUM 8.2* 7.0*  GLUCOSE 89 92    CBG (last 3)   Recent Labs  06/21/13 0235 06/21/13 0746 06/21/13 1143  GLUCAP 93 89 119*    Scheduled Meds: . antiseptic oral rinse  15 mL Mouth Rinse q12n4p  . chlorhexidine  15 mL Mouth Rinse BID  . docusate sodium  100 mg Oral BID  . ferrous sulfate  325 mg Oral BID WC  . fluconazole  100 mg Oral Daily  . heparin  5,000 Units Subcutaneous Q8H  . insulin aspart  0-9 Units  Subcutaneous TID WC  . OxyCODONE  15 mg Oral Q12H    Continuous Infusions: . sodium chloride 150 mL/hr (06/21/13 1440)    Past Medical History  Diagnosis Date  . Hemorrhoids   . ED (erectile dysfunction)   . Hypertension   . Cancer 04/05/13    rectal  . DM (diabetes mellitus)     History reviewed. No pertinent past surgical history.  Jarold Motto MS, RD, LDN Pager: [phone] After-hours pager: [phone]

## 2013-06-21 NOTE — Plan of Care (Signed)
Problem: Consults Goal: Skin Care Protocol Initiated - if Braden Score 18 or less If consults are not indicated, leave blank or document N/A Outcome: Not Met (add Reason) WOC consult pending. Patient with "severe" excoriation of bilaterally buttocks/rectal/scrotal area (area between rectum and scrotum). Skin is "RAW", tender, and red. Left OTA and encouraged patient to turn from hip to hip. Patient with severe pain with light touch to areas. Did place a pillow case around scrotum to absorb moisture from open areas. Keep patient clean. Can not do a flexiseal due to location of tumor. Prn antidiarrheals available.

## 2013-06-21 NOTE — Consult Note (Addendum)
WOC wound consult note Reason for Consult:Patient with denuded areas in perineum; base of scrotum, bilateral groin, intragluteal area with indication of full thickness ulceration (white/yellow base) measuring 6cm x 3cm in the intragluteal cleft. Patient has been having loose stools; antidiarrheals have been given and at the moment there is a decrease in stools.  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  Additionally, topical care will be with a zinc-based protective skin barrier, guidelines for application have been provided to the nursing staff in the orders section of this record. Patient positioning to be on the side except for meals to allow for reepithelialization if at all possible. WOC nursing team will not follow routinely, but will remain available to this patient, the nursing and medical teams.  Please re-consult if needed. Thanks, Ladona Mow, MSN, RN, GNP, [place], [facility] [phone])

## 2013-06-21 NOTE — Progress Notes (Signed)
Patient admitted to room 1314 from ED.

## 2013-06-21 NOTE — Plan of Care (Addendum)
Problem: Phase I Progression Outcomes Goal: Pain controlled with appropriate interventions Outcome: Not Met (add Reason) Patient with severe pain in rectal/buttock/scrotal area when area being cleaned. Encouraged patient to stay on hips instead of on back so not just "sitting in stool" when episodes occur. Patient not able to control bowels. After cleaning patient denies pain and refuses need for pain medication.

## 2013-06-21 NOTE — Progress Notes (Signed)
Patient admitted early this AM by Dr. Julian Reil- please see H&P.  Wound care for burns- appreciate help Air mattress Added Dr. Truett Perna to treatment team Diarrhea better- stool studies ordered  Marlin Canary DO

## 2013-06-21 NOTE — ED Notes (Signed)
Brendan Mosley [phone] HOME [phone] CELL Please call with room assignment when ready

## 2013-06-21 NOTE — ED Notes (Signed)
Entered patient room to administer pain medication, patient resting quietly at this time, eyes closed, chest observed for rise and fall. Will con't to monitor patient.

## 2013-06-21 NOTE — H&P (Signed)
[facility] History and Physical  Brendan Mosley ZOX:[id number] DOB: July 07, 1950 DOA: 06/20/2013  Referring physician: EDP PCP: No PCP Per Patient   Chief Complaint: AMS   HPI: Brendan Mosley is a 62 y.o. male with history of rectal cancer currently undergoing radiation and chemotherapy.  Patient presents with generalized weakness and confusion that was noted by girlfriend.  She states she had been trying to keep him hydrated and keep his electrolytes up as best as she could but she is concerned that he has become dehydrated.  She brought the patient in for evaluation.  Review of Systems: Systems reviewed.  As above, otherwise negative  Past Medical History  Diagnosis Date  . Hemorrhoids   . ED (erectile dysfunction)   . Hypertension   . Cancer 04/05/13    rectal  . DM (diabetes mellitus)    No past surgical history on file. Social History:  reports that he has never smoked. He has never used smokeless tobacco. He reports that he does not drink alcohol or use illicit drugs.  No Known Allergies  Family History  Problem Relation Age of Onset  . Hypertension Father      Prior to Admission medications   Medication Sig Start Date End Date Taking? Authorizing Provider  capecitabine (XELODA) 500 MG tablet Take 1,500 mg by mouth 2 (two) times daily after a meal.   Yes Historical Provider, MD  diphenoxylate-atropine (LOMOTIL) 2.5-0.025 MG per tablet Take 2 tablets by mouth 4 (four) times daily as needed for diarrhea or loose stools.   Yes Historical Provider, MD  ferrous sulfate 325 (65 FE) MG tablet Take 325 mg by mouth 2 (two) times daily with a meal.   Yes Historical Provider, MD  fluconazole (DIFLUCAN) 100 MG tablet Take 100 mg by mouth daily.   Yes Historical Provider, MD  loperamide (IMODIUM) 2 MG capsule Take 2 mg by mouth daily as needed for diarrhea or loose stools.   Yes Historical Provider, MD  neomycin-bacitracin-polymyxin (NEOSPORIN) ointment Apply 1 application  topically 4 (four) times daily as needed for wound care. apply to eye   Yes Historical Provider, MD  OxyCODONE (OXYCONTIN) 15 mg T12A 12 hr tablet Take 15 mg by mouth every 12 (twelve) hours.   Yes Historical Provider, MD  oxyCODONE-acetaminophen (PERCOCET) 7.5-325 MG per tablet Take 1-2 tablets by mouth every 4 (four) hours as needed for pain.   Yes Historical Provider, MD  docusate sodium (COLACE) 100 MG capsule Take 1 capsule (100 mg total) by mouth 2 (two) times daily. 04/17/13   Ladene Artist, MD  lidocaine (XYLOCAINE JELLY) 2 % jelly Apply 1 application topically as needed. 05/27/13   Jonna Coup, MD  silver sulfADIAZINE (SILVADENE) 1 % cream Apply 1 application topically daily. 06/07/13   Historical Provider, MD   Physical Exam: Filed Vitals:   06/20/13 2100  BP: 122/99  Pulse: 113  Temp: 97.6 F (36.4 C)  Resp: 22    BP 122/99  Pulse 113  Temp(Src) 97.6 F (36.4 C) (Oral)  Resp 22  SpO2 94%  General Appearance:    Alert, oriented, no distress, appears stated age  Head:    Normocephalic, atraumatic  Eyes:    PERRL, EOMI, sclera non-icteric        Nose:   Nares without drainage or epistaxis. Mucosa, turbinates normal  Throat:   Moist mucous membranes. Oropharynx without erythema or exudate.  Neck:   Supple. No carotid bruits.  No thyromegaly.  No lymphadenopathy.  Back:     No CVA tenderness, no spinal tenderness  Lungs:     Clear to auscultation bilaterally, without wheezes, rhonchi or rales  Chest wall:    No tenderness to palpitation  Heart:    Regular rate and rhythm without murmurs, gallops, rubs  Abdomen:     Soft, non-tender, nondistended, normal bowel sounds, no organomegaly  Genitalia:    Burn like wound to the enterity of the patients perineum with desquamation.  Rectal:    deferred  Extremities:   No clubbing, cyanosis or edema. Desquamation of palms.  Pulses:   2+ and symmetric all extremities  Skin:   Skin color, texture, turgor normal, no rashes or  lesions  Lymph nodes:   Cervical, supraclavicular, and axillary nodes normal  Neurologic:   CNII-XII intact. Normal strength, sensation and reflexes      throughout    Labs on Admission:  Basic Metabolic Panel:  Recent Labs Lab 06/20/13 2204  NA 126*  K 2.2*  CL 82*  CO2 24  GLUCOSE 89  BUN 30*  CREATININE 1.13  CALCIUM 8.2*   Liver Function Tests:  Recent Labs Lab 06/20/13 2204  AST 30  ALT 28  ALKPHOS 91  BILITOT 1.1  PROT 6.5  ALBUMIN 2.4*   No results found for this basename: LIPASE, AMYLASE,  in the last 168 hours No results found for this basename: AMMONIA,  in the last 168 hours CBC:  Recent Labs Lab 06/20/13 2204  WBC 6.9  NEUTROABS 6.2  HGB 13.9  HCT 40.4  MCV 81.5  PLT PLATELET CLUMPS NOTED ON SMEAR, COUNT APPEARS ADEQUATE   Cardiac Enzymes: No results found for this basename: CKTOTAL, CKMB, CKMBINDEX, TROPONINI,  in the last 168 hours  BNP (last 3 results) No results found for this basename: PROBNP,  in the last 8760 hours CBG: No results found for this basename: GLUCAP,  in the last 168 hours  Radiological Exams on Admission: Ct Head Wo Contrast  06/20/2013   CLINICAL DATA:  Altered mental status.  EXAM: CT HEAD WITHOUT CONTRAST  TECHNIQUE: Contiguous axial images were obtained from the base of the skull through the vertex without intravenous contrast.  COMPARISON:  None.  FINDINGS: No mass lesion, mass effect, midline shift, hydrocephalus, hemorrhage. No territorial ischemia or acute infarction. Periventricular white matter disease is present, most likely chronic ischemic. Paranasal sinuses demonstrate a right maxillary mucous retention cyst/ polyp. Mastoid air cells are clear. Scattered mucous retention cyst/ polyps in the right sphenoid and right ethmoid air cells.  IMPRESSION: No acute intracranial abnormality. 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   Electronically Signed   By: Andreas Newport M.D.   On: 06/20/2013  21:49    EKG: Independently reviewed.  Assessment/Plan Principal Problem:   Ionizing radiation burn Active Problems:   Hypokalemia   Dehydration   Hyponatremia   1. Dehydration - with associated electrolyte abnormalities of hyponatremia, hypokalemia.  This is occuring in the context of a radiation burn to his perenium.  Plan to replace electrolytes, hydrate, recheck labs in AM, and get wound care consult for his radiation burn.  Putting patient on reverse contact isolation to try and prevent infection to that area.    Code Status: Full Code  Family Communication: girlfriend at bedside Disposition Plan: Admit to inpatient   Time spent: 70 min  GARDNER, JARED M. [facility] Pager [phone]  If 7AM-7PM, please contact the day team taking care of the patient [email or web address] Password [email or web address]  06/21/2013, 12:40 AM

## 2013-06-21 NOTE — Plan of Care (Signed)
Problem: Phase I Progression Outcomes Goal: Other Phase I Outcomes/Goals Outcome: Not Met (add Reason) Patient with 4 episodes of watery diarrhea in 4 hours. Prn lomotil given. Will monitor.

## 2013-06-21 NOTE — Care Management Note (Signed)
   CARE MANAGEMENT NOTE 06/21/2013  Patient:  AMARRI, SATTERLY   Account Number:  [id number]  Date Initiated:  06/21/2013  Documentation initiated by:  Hershell Brandl  Subjective/Objective Assessment:   62 yo male admitted with dehydration, hypokalemia, and hyponatremia. Pt has Ionizing radiation burn.     Action/Plan:   Home when stable   Anticipated DC Date:     Anticipated DC Plan:  HOME/SELF CARE      DC Planning Services  CM consult      Choice offered to / List presented to:  NA   DME arranged  NA      DME agency  NA     HH arranged  NA      HH agency  NA   Status of service:  In process, will continue to follow Medicare Important Message given?   (If response is "NO", the following Medicare IM given date fields will be blank) Date Medicare IM given:   Date Additional Medicare IM given:    Discharge Disposition:    Per UR Regulation:  Reviewed for med. necessity/level of care/duration of stay  If discussed at Long Length of Stay Meetings, dates discussed:    Comments:  06/21/13 1400 Mayari Matus,MSN,RN [phone] Chart reviewed for utilization of services. PTA pt from home with girlfriend. Pt has Ionizing radiation burns.No PCP on record. Will continue to follow for possible Home Health needs.

## 2013-06-22 DIAGNOSIS — C801 Malignant (primary) neoplasm, unspecified: Secondary | ICD-10-CM

## 2013-06-22 DIAGNOSIS — E860 Dehydration: Secondary | ICD-10-CM

## 2013-06-22 DIAGNOSIS — R634 Abnormal weight loss: Secondary | ICD-10-CM

## 2013-06-22 DIAGNOSIS — R197 Diarrhea, unspecified: Secondary | ICD-10-CM

## 2013-06-22 DIAGNOSIS — L988 Other specified disorders of the skin and subcutaneous tissue: Secondary | ICD-10-CM

## 2013-06-22 DIAGNOSIS — C20 Malignant neoplasm of rectum: Secondary | ICD-10-CM

## 2013-06-22 DIAGNOSIS — D509 Iron deficiency anemia, unspecified: Secondary | ICD-10-CM

## 2013-06-22 DIAGNOSIS — E46 Unspecified protein-calorie malnutrition: Secondary | ICD-10-CM

## 2013-06-22 LAB — BASIC METABOLIC PANEL
CO2: 20 mEq/L (ref 19–32)
Calcium: 6.9 mg/dL — ABNORMAL LOW (ref 8.4–10.5)
Chloride: 98 mEq/L (ref 96–112)
GFR calc Af Amer: >90 mL/min (ref >90)
Glucose, Bld: 105 mg/dL — ABNORMAL HIGH (ref 70–99)
Potassium: 2.8 mEq/L — ABNORMAL LOW (ref 3.5–5.1)
Sodium: 128 mEq/L — ABNORMAL LOW (ref 135–145)

## 2013-06-22 LAB — URINE CULTURE

## 2013-06-22 LAB — MAGNESIUM: Magnesium: 2.1 mg/dL (ref 1.5–2.5)

## 2013-06-22 LAB — GLUCOSE, CAPILLARY
Glucose-Capillary: 112 mg/dL — ABNORMAL HIGH (ref 70–99)
Glucose-Capillary: 99 mg/dL (ref 70–99)

## 2013-06-22 LAB — CLOSTRIDIUM DIFFICILE BY PCR: Toxigenic C. Difficile by PCR: NEGATIVE

## 2013-06-22 MED ORDER — POTASSIUM CHLORIDE 10 MEQ/100ML IV SOLN
10.0000 meq | INTRAVENOUS | Status: AC
  Administered 2013-06-22 (×4): 10 meq via INTRAVENOUS
  Filled 2013-06-22 (×4): qty 100

## 2013-06-22 MED ORDER — MAGNESIUM SULFATE 40 MG/ML IJ SOLN
2.0000 g | Freq: Once | INTRAMUSCULAR | Status: AC
  Administered 2013-06-22: 2 g via INTRAVENOUS
  Filled 2013-06-22: qty 50

## 2013-06-22 MED ORDER — POTASSIUM CHLORIDE CRYS ER 20 MEQ PO TBCR
40.0000 meq | EXTENDED_RELEASE_TABLET | Freq: Once | ORAL | Status: AC
  Administered 2013-06-22: 40 meq via ORAL
  Filled 2013-06-22: qty 2

## 2013-06-22 NOTE — Progress Notes (Signed)
IP PROGRESS NOTE  Subjective:   He was admitted 06/21/2013 with diarrhea and dehydration. He continues to have diarrhea. He completed radiation 06/14/2013. Xeloda was discontinued prior to the completion of radiation secondary to skin breakdown at the perineum and diarrhea.  Objective: Vital signs in last 24 hours: Blood pressure 136/74, pulse 102, temperature 98.4 F (36.9 C), temperature source Oral, resp. rate 16, height 5\' 8"  (1.727 m), weight 123 lb (55.792 kg), SpO2 98.00%.  Intake/Output from previous day: 12/26 0701 - 12/27 0700 In: 4737.5 [P.O.:1060; I.V.:3577.5; IV Piggyback:100] Out: 450 [Urine:450]  Physical Exam:  HEENT: no thrush or ulcers Lungs: clear bilaterally Cardiac: regular rate and rhythm Abdomen: mildly distended, nontender, no hepatomegaly Extremities: no leg edema Skin: Hyperpigmentation with dry desquamation at the palms. Hyperpigmentation over the soles. Moist desquamation at the perineum, scrotum, and penis    Lab Results:  Recent Labs  06/20/13 2204 06/21/13 0655  WBC 6.9 4.4  HGB 13.9 10.0*  HCT 40.4 29.7*  PLT PLATELET CLUMPS NOTED ON SMEAR, COUNT APPEARS ADEQUATE 277    BMET  Recent Labs  06/21/13 0655 06/22/13 0425  NA 127* 128*  K 3.3* 2.8*  CL 93* 98  CO2 22 20  GLUCOSE 92 105*  BUN 25* 13  CREATININE 1.08 0.83  CALCIUM 7.0* 6.9*    Studies/Results: Ct Head Wo Contrast  06/20/2013   CLINICAL DATA:  Altered mental status.  EXAM: CT HEAD WITHOUT CONTRAST  TECHNIQUE: Contiguous axial images were obtained from the base of the skull through the vertex without intravenous contrast.  COMPARISON:  None.  FINDINGS: No mass lesion, mass effect, midline shift, hydrocephalus, hemorrhage. No territorial ischemia or acute infarction. Periventricular white matter disease is present, most likely chronic ischemic. Paranasal sinuses demonstrate a right maxillary mucous retention cyst/ polyp. Mastoid air cells are clear. Scattered mucous  retention cyst/ polyps in the right sphenoid and right ethmoid air cells.  IMPRESSION: No acute intracranial abnormality. 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   Electronically Signed   By: Andreas Newport M.D.   On: 06/20/2013 21:49    Medications: I have reviewed the patient's current medications.  Assessment/Plan:  1. Rectal cancer, clinical stage IV, presenting with a rectal mass, perirectal lymphadenopathy and a hypermetabolic liver lesion. Initiation of concurrent Xeloda and radiation on 05/06/2013.  Xeloda placed on hold beginning 06/06/2013 due to diarrhea, hand-foot syndrome and skin breakdown at the scrotum/perineum. Radiation completed 06/14/2013 2. Pain/constipation/rectal bleeding secondary to #1. Improvement in the "rectal" pain since beginning treatment 3. History of hypertension. 4. Hand/foot syndrome secondary to capecitabine.improved 5. Anemia. Iron deficiency. 6. Skin breakdown at the scrotum and perineum. 7. "Diarrhea".potentially related to Xeloda, radiation, or infectious enteritis 8. Oral candidiasis 06/06/2013. He completed a course of Diflucan. 9. Weight loss.  10. Malnutrition 11. Dehydration  Mr. Bollard was admitted with diarrhea and dehydration. He is malnourished. The diarrhea may be related to Xeloda-induced enteritis, though the Xeloda was discontinued 2 weeks ago. A stool sample for the C. Difficile toxin is pending.the Lomotil and Imodium can be continued if the C. Difficile toxin is negative. We can add Sandostatin if the diarrhea does not improve.  He has developed severe radiation skin breakdown at the scrotum and perineum. The scrotal breakdown appears slightly improved today. The hand/foot syndrome secondary to Xeloda has improved.  Recommendations: 1. Continue intravenous hydration and antidiarrheals 2. Followup C. Difficile toxin assay 3. Replete potassium 4. Supportive care for the skin breakdown 5. outpatient restaging  CT  evaluation as scheduled  Please call oncology as needed on 06/23/2013. I will see him 06/24/2013.    LOS: 2 days   Amire Gossen  06/22/2013, 11:20 AM

## 2013-06-22 NOTE — Progress Notes (Signed)
Patient given prn antidiarrheals last night. Last at 0230 after watery bowel movement. No stool since then. c diff and stool culture sent and pending.

## 2013-06-22 NOTE — Progress Notes (Signed)
Urine culture + with staph aureus 50,000 colonies. Paged Lenny Pastel NP. No new orders.

## 2013-06-22 NOTE — Progress Notes (Signed)
[facility] PROGRESS NOTE  Brendan Mosley ZOX:[id number] DOB: January 04, 1951 DOA: 06/20/2013 PCP: No PCP Per Patient  Assessment/Plan: Radiation burns with breakdown in the scrotum and perineum:  Wound care  Rectal cancer- per Dr. Truett Mosley  Diarrhea- in process to r/o c-diff and stool culture; if still having in AM- consider Sandostatin   -cut IVF in half  Hypokalemia- replete  hypo magnesium- replete  Severe malnutrition- watch Mg and Phos Weight loss   Code Status: full Family Communication: called son- [phone]- LM Disposition Plan:    Consultants:  Brendan Mosley  Procedures:    Antibiotics:    HPI/Subjective: Still having diarrhea  Objective: Filed Vitals:   06/22/13 0428  BP: 136/74  Pulse: 102  Temp: 98.4 F (36.9 C)  Resp: 16    Intake/Output Summary (Last 24 hours) at 06/22/13 1221 Last data filed at 06/22/13 8295  Gross per 24 hour  Intake 4497.5 ml  Output    250 ml  Net 4247.5 ml   Filed Weights   06/22/13 0645  Weight: 55.792 kg (123 lb)    Exam:   General:  Thin, ill appearing  Cardiovascular: rrr  Respiratory: clear anterior  Abdomen: +BS, soft  Musculoskeletal: moves all 4 ext   Data Reviewed: Basic Metabolic Panel:  Recent Labs Lab 06/20/13 2204 06/21/13 0655 06/22/13 0425  NA 126* 127* 128*  K 2.2* 3.3* 2.8*  CL 82* 93* 98  CO2 24 22 20   GLUCOSE 89 92 105*  BUN 30* 25* 13  CREATININE 1.13 1.08 0.83  CALCIUM 8.2* 7.0* 6.9*  MG  --   --  2.1  PHOS  --   --  1.1*   Liver Function Tests:  Recent Labs Lab 06/20/13 2204  AST 30  ALT 28  ALKPHOS 91  BILITOT 1.1  PROT 6.5  ALBUMIN 2.4*   No results found for this basename: LIPASE, AMYLASE,  in the last 168 hours No results found for this basename: AMMONIA,  in the last 168 hours CBC:  Recent Labs Lab 06/20/13 2204 06/21/13 0655  WBC 6.9 4.4  NEUTROABS 6.2  --   HGB 13.9 10.0*  HCT 40.4 29.7*  MCV 81.5 81.1  PLT PLATELET CLUMPS NOTED ON SMEAR,  COUNT APPEARS ADEQUATE 277   Cardiac Enzymes: No results found for this basename: CKTOTAL, CKMB, CKMBINDEX, TROPONINI,  in the last 168 hours BNP (last 3 results) No results found for this basename: PROBNP,  in the last 8760 hours CBG:  Recent Labs Lab 06/21/13 0235 06/21/13 0746 06/21/13 1143 06/21/13 1645 06/21/13 2121  GLUCAP 93 89 119* 90 117*    Recent Results (from the past 240 hour(s))  URINE CULTURE     Status: None   Collection Time    06/21/13 12:02 AM      Result Value Range Status   Specimen Description URINE, CATHETERIZED   Final   Special Requests NONE   Final   Culture  Setup Time     Final   Value: 06/21/2013 04:08     Performed at [facility] Count     Final   Value: 50,000 COLONIES/ML     Performed at [facility]   Culture     Final   Value: STAPHYLOCOCCUS AUREUS     Note: RIFAMPIN AND GENTAMICIN SHOULD NOT BE USED AS SINGLE DRUGS FOR TREATMENT OF STAPH INFECTIONS.     Performed at [facility]   Report Status PENDING   Incomplete  Studies: Ct Head Wo Contrast  06/20/2013   CLINICAL DATA:  Altered mental status.  EXAM: CT HEAD WITHOUT CONTRAST  TECHNIQUE: Contiguous axial images were obtained from the base of the skull through the vertex without intravenous contrast.  COMPARISON:  None.  FINDINGS: No mass lesion, mass effect, midline shift, hydrocephalus, hemorrhage. No territorial ischemia or acute infarction. Periventricular white matter disease is present, most likely chronic ischemic. Paranasal sinuses demonstrate a right maxillary mucous retention cyst/ polyp. Mastoid air cells are clear. Scattered mucous retention cyst/ polyps in the right sphenoid and right ethmoid air cells.  IMPRESSION: No acute intracranial abnormality. 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   Electronically Signed   By: Brendan Mosley M.D.   On: 06/20/2013 21:49    Scheduled Meds: . antiseptic oral rinse  15 mL  Mouth Rinse q12n4p  . chlorhexidine  15 mL Mouth Rinse BID  . feeding supplement (RESOURCE BREEZE)  1 Container Oral Q24H  . ferrous sulfate  325 mg Oral BID WC  . fluconazole  100 mg Oral Daily  . heparin  5,000 Units Subcutaneous Q8H  . insulin aspart  0-9 Units Subcutaneous TID WC  . magnesium sulfate 1 - 4 g bolus IVPB  2 g Intravenous Once  . OxyCODONE  15 mg Oral Q12H  . potassium chloride  40 mEq Oral Once   Continuous Infusions: . sodium chloride 1,000 mL (06/22/13 0650)    Principal Problem:   Ionizing radiation burn Active Problems:   Hypokalemia   Dehydration   Hyponatremia    Time spent: 35 min    Brendan Mosley  [facility] Pager [phone] 7PM-7AM, please contact night-coverage at [email or web address], password [facility] 06/22/2013, 12:21 PM  LOS: 2 days

## 2013-06-22 NOTE — Progress Notes (Signed)
K+ 2.8, phos 1.1, calcium 6.9, Na+ 128 paged NP Lenny Pastel with results. New orders for KCL runs x4 entered.

## 2013-06-23 LAB — CBC
MCHC: 33.2 g/dL (ref 30.0–36.0)
Platelets: 231 10*3/uL (ref 150–400)
RBC: 3.67 MIL/uL — ABNORMAL LOW (ref 4.22–5.81)
WBC: 2.3 10*3/uL — ABNORMAL LOW (ref 4.0–10.5)

## 2013-06-23 LAB — BASIC METABOLIC PANEL
BUN: 9 mg/dL (ref 6–23)
CO2: 18 mEq/L — ABNORMAL LOW (ref 19–32)
Chloride: 101 mEq/L (ref 96–112)
Potassium: 3.2 mEq/L — ABNORMAL LOW (ref 3.5–5.1)
Sodium: 129 mEq/L — ABNORMAL LOW (ref 135–145)

## 2013-06-23 LAB — MAGNESIUM: Magnesium: 2.3 mg/dL (ref 1.5–2.5)

## 2013-06-23 LAB — GLUCOSE, CAPILLARY
Glucose-Capillary: 84 mg/dL (ref 70–99)
Glucose-Capillary: 109 mg/dL — ABNORMAL HIGH (ref 70–99)
Glucose-Capillary: 94 mg/dL (ref 70–99)

## 2013-06-23 MED ORDER — POTASSIUM CHLORIDE CRYS ER 20 MEQ PO TBCR
40.0000 meq | EXTENDED_RELEASE_TABLET | Freq: Once | ORAL | Status: AC
  Administered 2013-06-23: 40 meq via ORAL
  Filled 2013-06-23: qty 2

## 2013-06-23 MED ORDER — SULFAMETHOXAZOLE-TMP DS 800-160 MG PO TABS
1.0000 | ORAL_TABLET | Freq: Two times a day (BID) | ORAL | Status: DC
  Administered 2013-06-23 – 2013-06-24 (×3): 1 via ORAL
  Filled 2013-06-23 (×4): qty 1

## 2013-06-23 MED ORDER — POTASSIUM PHOSPHATE DIBASIC 3 MMOLE/ML IV SOLN
20.0000 mmol | Freq: Once | INTRAVENOUS | Status: DC
  Filled 2013-06-23: qty 6.67

## 2013-06-23 MED ORDER — SODIUM CHLORIDE 0.9 % IV SOLN: INTRAVENOUS | Status: DC

## 2013-06-23 MED ORDER — POTASSIUM CHLORIDE IN NACL 20-0.9 MEQ/L-% IV SOLN
INTRAVENOUS | Status: DC
  Administered 2013-06-23 – 2013-06-26 (×7): via INTRAVENOUS
  Filled 2013-06-23 (×10): qty 1000

## 2013-06-23 NOTE — Progress Notes (Addendum)
[facility] PROGRESS NOTE  Brendan Mosley ZOX:[id number] DOB: [birth year] DOA: 06/20/2013 PCP: No PCP Per Patient  Assessment/Plan: Radiation burns with breakdown in the scrotum and perineum:  Wound care Staph aureus bacteriuria - likely from local skin, but patient does describe dysuria for the past week. Per microbiology results, start Bactrim.  Rectal cancer- per Dr. Truett Perna Diarrhea - C diff negative.  Hypokalemia- replete Hypomagnesemia- replete Severe malnutrition - watch Mg and Phos - have asked for nutrition consult Hyponatremia - continue IVF. Weight loss - likely multifactorial, appreciate nutrition consult.   Code Status: full Family Communication: called son - [phone] 12/27 Disposition Plan:   Consultants:  Truett Perna  Procedures:  none  Antibiotics:  Bactrim 12/28 >>  HPI/Subjective: - complains of pain in the burn area  Objective: Filed Vitals:   06/23/13 0456  BP: 146/81  Pulse: 102  Temp: 99.2 F (37.3 C)  Resp: 16    Intake/Output Summary (Last 24 hours) at 06/23/13 1148 Last data filed at 06/23/13 0500  Gross per 24 hour  Intake 2613.75 ml  Output      0 ml  Net 2613.75 ml   Filed Weights   06/22/13 0645  Weight: 55.792 kg (123 lb)    Exam:  General:  Thin, ill appearing  Cardiovascular: rrr  Respiratory: clear anterior  Abdomen: +BS, soft  Musculoskeletal: moves all 4 ext   Data Reviewed: Basic Metabolic Panel:  Recent Labs Lab 06/20/13 2204 06/21/13 0655 06/22/13 0425 06/23/13 0447  NA 126* 127* 128* 129*  K 2.2* 3.3* 2.8* 3.2*  CL 82* 93* 98 101  CO2 24 22 20  18*  GLUCOSE 89 92 105* 87  BUN 30* 25* 13 9  CREATININE 1.13 1.08 0.83 0.81  CALCIUM 8.2* 7.0* 6.9* 7.0*  MG  --   --  2.1 2.3  PHOS  --   --  1.1* 1.2*   Liver Function Tests:  Recent Labs Lab 06/20/13 2204  AST 30  ALT 28  ALKPHOS 91  BILITOT 1.1  PROT 6.5  ALBUMIN 2.4*   CBC:  Recent Labs Lab 06/20/13 2204 06/21/13 0655  06/23/13 0447  WBC 6.9 4.4 2.3*  NEUTROABS 6.2  --   --   HGB 13.9 10.0* 10.0*  HCT 40.4 29.7* 30.1*  MCV 81.5 81.1 82.0  PLT PLATELET CLUMPS NOTED ON SMEAR, COUNT APPEARS ADEQUATE 277 231   CBG:  Recent Labs Lab 06/21/13 1645 06/21/13 2121 06/22/13 1301 06/22/13 1722 06/23/13 0750  GLUCAP 90 117* 112* 99 84    Recent Results (from the past 240 hour(s))  CULTURE, BLOOD (ROUTINE X 2)     Status: None   Collection Time    06/20/13 11:33 PM      Result Value Range Status   Specimen Description BLOOD LEFT WRIST   Final   Special Requests BOTTLES DRAWN AEROBIC AND ANAEROBIC 5CC   Final   Culture  Setup Time     Final   Value: 06/21/2013 04:06     Performed at [facility]   Culture     Final   Value:        BLOOD CULTURE RECEIVED NO GROWTH TO DATE CULTURE WILL BE HELD FOR 5 DAYS BEFORE ISSUING A FINAL NEGATIVE REPORT     Performed at [facility]   Report Status PENDING   Incomplete  CULTURE, BLOOD (ROUTINE X 2)     Status: None   Collection Time    06/20/13 11:44 PM  Result Value Range Status   Specimen Description BLOOD RIGHT WRIST   Final   Special Requests BOTTLES DRAWN AEROBIC ONLY 5CC   Final   Culture  Setup Time     Final   Value: 06/21/2013 04:06     Performed at [facility]   Culture     Final   Value:        BLOOD CULTURE RECEIVED NO GROWTH TO DATE CULTURE WILL BE HELD FOR 5 DAYS BEFORE ISSUING A FINAL NEGATIVE REPORT     Performed at [facility]   Report Status PENDING   Incomplete  URINE CULTURE     Status: None   Collection Time    06/21/13 12:02 AM      Result Value Range Status   Specimen Description URINE, CATHETERIZED   Final   Special Requests NONE   Final   Culture  Setup Time     Final   Value: 06/21/2013 04:08     Performed at [facility] Count     Final   Value: 50,000 COLONIES/ML     Performed at [facility]   Culture     Final   Value: STAPHYLOCOCCUS AUREUS     Note:  RIFAMPIN AND GENTAMICIN SHOULD NOT BE USED AS SINGLE DRUGS FOR TREATMENT OF STAPH INFECTIONS.     Performed at [facility]   Report Status 06/22/2013 FINAL   Final   Organism ID, Bacteria STAPHYLOCOCCUS AUREUS   Final  CLOSTRIDIUM DIFFICILE BY PCR     Status: None   Collection Time    06/22/13 12:11 AM      Result Value Range Status   C difficile by pcr NEGATIVE  NEGATIVE Final   Comment: Performed at [facility]     Studies: No results found.  Scheduled Meds: . antiseptic oral rinse  15 mL Mouth Rinse q12n4p  . chlorhexidine  15 mL Mouth Rinse BID  . feeding supplement (RESOURCE BREEZE)  1 Container Oral Q24H  . ferrous sulfate  325 mg Oral BID WC  . fluconazole  100 mg Oral Daily  . heparin  5,000 Units Subcutaneous Q8H  . insulin aspart  0-9 Units Subcutaneous TID WC  . OxyCODONE  15 mg Oral Q12H   Continuous Infusions: . 0.9 % NaCl with KCl 20 mEq / L 75 mL/hr at 06/23/13 1115    Principal Problem:   Ionizing radiation burn Active Problems:   Hypokalemia   Dehydration   Hyponatremia  Time spent: 25 min  Pamella Pert  [facility] Pager [phone] 7PM-7AM, please contact night-coverage at [email or web address], password [facility] 06/23/2013, 11:48 AM  LOS: 3 days

## 2013-06-23 NOTE — Progress Notes (Addendum)
NUTRITION   DOCUMENTATION CODES Per approved criteria  -Severe malnutrition in the context of chronic illness   INTERVENTION:  Continue to Monitor magnesium, potassium, and phosphorus daily for at least 3 days, MD to replete as needed, as pt is at risk for refeeding syndrome given ongoing severe malnutrition.  Continue Resource Breeze po daily, each supplement provides 250 kcal and 9 grams of protein.  Provide preferences.  RD to continue to follow nutrition care plan.  NUTRITION DIAGNOSIS: Inadequate oral intake related to catabolic illness as evidenced by estimated needs.   Goal: Intake to meet >90% of estimated nutrition needs.  Monitor:  weight trends, lab trends, I/O's, PO intake, supplement tolerance  Reason for Assessment: Malnutrition Screening Tool  62 y.o. male  Admitting Dx: Ionizing radiation burn  ASSESSMENT: PMHx significant for rectal CA and DM; currently undergoing radiation and chemotherapy. Admitted with AMS. Work-up reveals dehydration.  WOC RN saw pt on 12/26 for radiation dermatitis and MASD.  Pt has been seen by oncology RD in the past few months and he has been given Ensure. Pt states that he does not want to receive Ensure while here.  Currently ordered for a Dysphagia 3 diet with thin liquids. Eating about 50% of meals.  Pt with 12% wt loss x 1 month. Pt appears cachectic.  12/28:  Patient reports tolerating meals with fair intake (about 50%).  Drinking [employer].  Patient had not other needs at this time.    Nutrition Focused Physical Exam:  Subcutaneous Fat:  Orbital Region: severe depletion Upper Arm Region: severe depletion Thoracic and Lumbar Region: severe depletion  Muscle:  Temple Region: severe depletion Clavicle Bone Region: severe depletion Clavicle and Acromion Bone Region: severe depletion Scapular Bone Region: severe depletion Dorsal Hand: severe depletion Patellar Region: severe depletion Anterior Thigh Region:  severe depletion Posterior Calf Region: severe depletion  Edema: n/a  Pt meets criteria for severe MALNUTRITION in the context of chronic illness as evidenced by 12% of weight loss x 1 month and severe fat and muscle mass loss.  Potassium remains low.  Magnesium WNL.  KCL and Mg supplements given.  Phos remains low.  Height: Ht Readings from Last 1 Encounters:  06/22/13 5\' 8"  (1.727 m)    Weight: Wt Readings from Last 1 Encounters:  06/22/13 123 lb (55.792 kg)    Ideal Body Weight: 154 lb  % Ideal Body Weight: 80%  Wt Readings from Last 10 Encounters:  06/22/13 123 lb (55.792 kg)  06/11/13 123 lb 14.4 oz (56.201 kg)  06/07/13 128 lb 4.8 oz (58.196 kg)  06/06/13 131 lb (59.421 kg)  05/30/13 139 lb 9.6 oz (63.322 kg)  05/27/13 135 lb 6.4 oz (61.417 kg)  05/20/13 141 lb 6.4 oz (64.139 kg)  05/17/13 147 lb 1.6 oz (66.724 kg)  05/14/13 144 lb 3.2 oz (65.409 kg)  05/10/13 140 lb 9.6 oz (63.776 kg)    Usual Body Weight: 140 lb  % Usual Body Weight: 88%  BMI:  18.7 - normal weight  Estimated Nutritional Needs: Kcal: 1680 - 1800 Protein: at least 84 grams daily Fluid: 1.7 - 2 liters  Skin:  radiation dermatitis and MASD to buttocks  Diet Order: Dysphagia 3; thin  EDUCATION NEEDS: -No education needs identified at this time   Intake/Output Summary (Last 24 hours) at 06/23/13 1304 Last data filed at 06/23/13 0500  Gross per 24 hour  Intake 1688.75 ml  Output      0 ml  Net 1688.75 ml  Last BM: 12/26  Labs:   Recent Labs Lab 06/21/13 0655 06/22/13 0425 06/23/13 0447  NA 127* 128* 129*  K 3.3* 2.8* 3.2*  CL 93* 98 101  CO2 22 20 18*  BUN 25* 13 9  CREATININE 1.08 0.83 0.81  CALCIUM 7.0* 6.9* 7.0*  MG  --  2.1 2.3  PHOS  --  1.1* 1.2*  GLUCOSE 92 105* 87    CBG (last 3)   Recent Labs  06/22/13 1722 06/23/13 0750 06/23/13 1257  GLUCAP 99 84 130*    Scheduled Meds: . antiseptic oral rinse  15 mL Mouth Rinse q12n4p  . chlorhexidine  15  mL Mouth Rinse BID  . feeding supplement (RESOURCE BREEZE)  1 Container Oral Q24H  . ferrous sulfate  325 mg Oral BID WC  . fluconazole  100 mg Oral Daily  . heparin  5,000 Units Subcutaneous Q8H  . insulin aspart  0-9 Units Subcutaneous TID WC  . OxyCODONE  15 mg Oral Q12H  . potassium chloride  40 mEq Oral Once  . sulfamethoxazole-trimethoprim  1 tablet Oral Q12H    Continuous Infusions: . 0.9 % NaCl with KCl 20 mEq / L 75 mL/hr at 06/23/13 1115    Past Medical History  Diagnosis Date  . Hemorrhoids   . ED (erectile dysfunction)   . Hypertension   . Cancer 04/05/13    rectal  . DM (diabetes mellitus)     History reviewed. No pertinent past surgical history.  Jarold Motto MS, RD, LDN Pager: [phone] After-hours pager: [phone]

## 2013-06-24 LAB — CBC
HCT: 28 % — ABNORMAL LOW (ref 39.0–52.0)
Hemoglobin: 9.7 g/dL — ABNORMAL LOW (ref 13.0–17.0)
Platelets: 189 10*3/uL (ref 150–400)
RDW: 20.7 % — ABNORMAL HIGH (ref 11.5–15.5)
WBC: 2.4 10*3/uL — ABNORMAL LOW (ref 4.0–10.5)

## 2013-06-24 LAB — BASIC METABOLIC PANEL
Chloride: 103 mEq/L (ref 96–112)
Creatinine, Ser: 0.85 mg/dL (ref 0.50–1.35)
GFR calc Af Amer: >90 mL/min (ref >90)
GFR calc non Af Amer: >90 mL/min (ref >90)
Glucose, Bld: 87 mg/dL (ref 70–99)
Potassium: 4 mEq/L (ref 3.5–5.1)
Sodium: 129 mEq/L — ABNORMAL LOW (ref 135–145)

## 2013-06-24 LAB — GLUCOSE, CAPILLARY
Glucose-Capillary: 89 mg/dL (ref 70–99)
Glucose-Capillary: 133 mg/dL — ABNORMAL HIGH (ref 70–99)
Glucose-Capillary: 78 mg/dL (ref 70–99)
Glucose-Capillary: 93 mg/dL (ref 70–99)

## 2013-06-24 LAB — MAGNESIUM: Magnesium: 2 mg/dL (ref 1.5–2.5)

## 2013-06-24 MED ORDER — HYDROMORPHONE HCL PF 1 MG/ML IJ SOLN
1.0000 mg | INTRAMUSCULAR | Status: DC | PRN
  Administered 2013-06-24 – 2013-06-26 (×9): 1 mg via INTRAVENOUS
  Filled 2013-06-24 (×9): qty 1

## 2013-06-24 MED ORDER — K PHOS MONO-SOD PHOS DI & MONO 155-852-130 MG PO TABS
500.0000 mg | ORAL_TABLET | Freq: Two times a day (BID) | ORAL | Status: DC
  Administered 2013-06-24 – 2013-06-25 (×3): 500 mg via ORAL
  Filled 2013-06-24 (×7): qty 2

## 2013-06-24 MED ORDER — CHOLESTYRAMINE 4 G PO PACK
4.0000 g | PACK | Freq: Two times a day (BID) | ORAL | Status: DC
  Administered 2013-06-24 – 2013-06-26 (×4): 4 g via ORAL
  Filled 2013-06-24 (×6): qty 1

## 2013-06-24 NOTE — Progress Notes (Signed)
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  -repeat UA with reflex to urine culture -d/c Bactrim as previous UA did not show pyuria -suspect skin contamination in previous urine culture Rectal cancer- per Dr. Truett Perna  Diarrhea - C diff negative. -slowly improving  -questran to slow stool Hypokalemia- replete  Hypomagnesemia- replete  Severe malnutrition - watch Mg and Phos  - appreciate nutrition consult  Hyponatremia - continue IVF--increase rate to 100cc/hr -Suspect due to insensible loss and volume depletion -Check urine and serum osmolarity -TSH -am cortisol -am uric acid Weight loss - likely multifactorial, appreciate nutrition consult.  Code Status: full  Family Communication: called son yesterday - [phone] 12/27  Disposition Plan:  Consultants:  Truett Perna Procedures:  none Antibiotics:  Bactrim 12/28 >> 06/24/2013       Procedures/Studies: Ct Head Wo Contrast  06/20/2013   CLINICAL DATA:  Altered mental status.  EXAM: CT HEAD WITHOUT CONTRAST  TECHNIQUE: Contiguous axial images were obtained from the base of the skull through the vertex without intravenous contrast.  COMPARISON:  None.  FINDINGS: No mass lesion, mass effect, midline shift, hydrocephalus, hemorrhage. No territorial ischemia or acute infarction. Periventricular white matter disease is present, most likely chronic ischemic. Paranasal sinuses demonstrate a right maxillary mucous retention cyst/ polyp. Mastoid air cells are clear. Scattered mucous retention cyst/ polyps in the right sphenoid and right ethmoid air cells.  IMPRESSION: No acute intracranial abnormality.  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   Electronically Signed   By: Andreas Newport M.D.   On: 06/20/2013 21:49         Subjective: Patient complains of uncontrolled pain in the perineum. He states that he does not last that long. Denies any nausea, vomiting, chest pain, shortness breath, abdominal pain. He had 2-3 loose bowel movements today. Denies fevers or chills.  Objective: Filed Vitals:   06/23/13 1455 06/23/13 1951 06/24/13 0441 06/24/13 1411  BP: 120/83 113/79 138/85 120/84  Pulse: 115 107 99 102  Temp: 97.7 F (36.5 C) 99.8 F (37.7 C) 98.1 F (36.7 C) 98.2 F (36.8 C)  TempSrc: Oral Oral Oral Oral  Resp: 18 16 18 18   Height:      Weight:      SpO2: 100% 100% 100% 100%    Intake/Output Summary (Last 24 hours) at 06/24/13 1746 Last data filed at 06/24/13 1352  Gross per 24 hour  Intake   1530 ml  Output    650 ml  Net    880 ml   Weight change:  Exam:   General:  Pt is alert, follows commands appropriately, not in acute distress  HEENT: No icterus, No thrush,  /AT  Cardiovascular: RRR, S1/S2, no rubs, no gallops  Respiratory: CTA bilaterally, no wheezing, no crackles, no rhonchi  Abdomen: Soft/+BS, non tender, non distended, no guarding  Extremities: No edema, No lymphangitis, No petechiae, No rashes, no synovitis  GU-multiple shallow ulcerations in the penile and scrotal areas without any necrosis or purulent drainage.  Data Reviewed: Basic Metabolic Panel:  Recent Labs Lab 06/20/13 2204 06/21/13 0655 06/22/13 0425 06/23/13 0447 06/24/13 0714  NA 126* 127* 128* 129* 129*  K 2.2* 3.3* 2.8* 3.2* 4.0  CL 82* 93* 98 101  103  CO2 24 22 20  18* 17*  GLUCOSE 89 92 105* 87 87  BUN 30* 25* 13 9 11   CREATININE 1.13 1.08 0.83 0.81 0.85  CALCIUM 8.2* 7.0* 6.9* 7.0* 7.2*  MG  --   --  2.1 2.3 2.0  PHOS  --   --  1.1* 1.2* 1.4*   Liver Function Tests:  Recent Labs Lab 06/20/13 2204  AST 30  ALT 28  ALKPHOS 91   BILITOT 1.1  PROT 6.5  ALBUMIN 2.4*   No results found for this basename: LIPASE, AMYLASE,  in the last 168 hours No results found for this basename: AMMONIA,  in the last 168 hours CBC:  Recent Labs Lab 06/20/13 2204 06/21/13 0655 06/23/13 0447 06/24/13 0714  WBC 6.9 4.4 2.3* 2.4*  NEUTROABS 6.2  --   --   --   HGB 13.9 10.0* 10.0* 9.7*  HCT 40.4 29.7* 30.1* 28.0*  MCV 81.5 81.1 82.0 81.4  PLT PLATELET CLUMPS NOTED ON SMEAR, COUNT APPEARS ADEQUATE 277 231 189   Cardiac Enzymes: No results found for this basename: CKTOTAL, CKMB, CKMBINDEX, TROPONINI,  in the last 168 hours BNP: No components found with this basename: POCBNP,  CBG:  Recent Labs Lab 06/23/13 1714 06/23/13 2134 06/24/13 0739 06/24/13 1122 06/24/13 1649  GLUCAP 109* 94 89 133* 78    Recent Results (from the past 240 hour(s))  CULTURE, BLOOD (ROUTINE X 2)     Status: None   Collection Time    06/20/13 11:33 PM      Result Value Range Status   Specimen Description BLOOD LEFT WRIST   Final   Special Requests BOTTLES DRAWN AEROBIC AND ANAEROBIC 5CC   Final   Culture  Setup Time     Final   Value: 06/21/2013 04:06     Performed at [facility]   Culture     Final   Value:        BLOOD CULTURE RECEIVED NO GROWTH TO DATE CULTURE WILL BE HELD FOR 5 DAYS BEFORE ISSUING A FINAL NEGATIVE REPORT     Performed at [facility]   Report Status PENDING   Incomplete  CULTURE, BLOOD (ROUTINE X 2)     Status: None   Collection Time    06/20/13 11:44 PM      Result Value Range Status   Specimen Description BLOOD RIGHT WRIST   Final   Special Requests BOTTLES DRAWN AEROBIC ONLY 5CC   Final   Culture  Setup Time     Final   Value: 06/21/2013 04:06     Performed at [facility]   Culture     Final   Value:        BLOOD CULTURE RECEIVED NO GROWTH TO DATE CULTURE WILL BE HELD FOR 5 DAYS BEFORE ISSUING A FINAL NEGATIVE REPORT     Performed at [facility]   Report Status PENDING    Incomplete  URINE CULTURE     Status: None   Collection Time    06/21/13 12:02 AM      Result Value Range Status   Specimen Description URINE, CATHETERIZED   Final   Special Requests NONE   Final   Culture  Setup Time     Final   Value: 06/21/2013 04:08     Performed at [facility] Count     Final   Value: 50,000 COLONIES/ML     Performed at [facility]   Culture     Final   Value: STAPHYLOCOCCUS AUREUS     Note: RIFAMPIN AND GENTAMICIN SHOULD NOT BE USED AS SINGLE DRUGS FOR TREATMENT OF STAPH INFECTIONS.     Performed at [facility]   Report Status 06/22/2013 FINAL   Final   Organism ID, Bacteria STAPHYLOCOCCUS AUREUS   Final  CLOSTRIDIUM DIFFICILE BY PCR     Status: None   Collection Time    06/22/13 12:11 AM      Result Value Range Status   C difficile by pcr NEGATIVE  NEGATIVE Final   Comment: Performed at [facility]  STOOL CULTURE     Status: None   Collection Time    06/22/13 12:11 AM      Result Value Range Status   Specimen Description STOOL   Final   Special Requests NONE   Final   Culture     Final   Value: NO SUSPICIOUS COLONIES, CONTINUING TO HOLD     Performed at [facility]   Report Status PENDING   Incomplete     Scheduled Meds: . antiseptic oral rinse  15 mL Mouth Rinse q12n4p  . chlorhexidine  15 mL Mouth Rinse BID  . feeding supplement (RESOURCE BREEZE)  1 Container Oral Q24H  . ferrous sulfate  325 mg Oral BID WC  . fluconazole  100 mg Oral Daily  . heparin  5,000 Units Subcutaneous Q8H  . insulin aspart  0-9 Units Subcutaneous TID WC  . OxyCODONE  15 mg Oral Q12H  . sulfamethoxazole-trimethoprim  1 tablet Oral Q12H   Continuous Infusions: . 0.9 % NaCl with KCl 20 mEq / L 75 mL/hr at 06/24/13 1352     Rodric Punch, DO  [facility] Pager [phone]  If 7PM-7AM, please contact night-coverage [email or web address] Password [email or web address] 06/24/2013, 5:46 PM   LOS: 4 days

## 2013-06-25 DIAGNOSIS — L989 Disorder of the skin and subcutaneous tissue, unspecified: Secondary | ICD-10-CM

## 2013-06-25 DIAGNOSIS — N509 Disorder of male genital organs, unspecified: Secondary | ICD-10-CM

## 2013-06-25 LAB — BASIC METABOLIC PANEL
BUN: 13 mg/dL (ref 6–23)
CO2: 16 mEq/L — ABNORMAL LOW (ref 19–32)
Chloride: 107 mEq/L (ref 96–112)
Creatinine, Ser: 0.9 mg/dL (ref 0.50–1.35)
Potassium: 4.4 mEq/L (ref 3.7–5.3)

## 2013-06-25 LAB — URINALYSIS W MICROSCOPIC + REFLEX CULTURE
Glucose, UA: NEGATIVE mg/dL
Nitrite: NEGATIVE
Protein, ur: 30 mg/dL — AB
Urobilinogen, UA: 1 mg/dL (ref 0.0–1.0)
pH: 6 (ref 5.0–8.0)

## 2013-06-25 LAB — STOOL CULTURE

## 2013-06-25 LAB — GLUCOSE, CAPILLARY
Glucose-Capillary: 82 mg/dL (ref 70–99)
Glucose-Capillary: 98 mg/dL (ref 70–99)
Glucose-Capillary: 94 mg/dL (ref 70–99)

## 2013-06-25 LAB — MAGNESIUM: Magnesium: 1.8 mg/dL (ref 1.5–2.5)

## 2013-06-25 MED ORDER — ADULT MULTIVITAMIN W/MINERALS CH
1.0000 | ORAL_TABLET | Freq: Every day | ORAL | Status: DC
  Administered 2013-06-25 – 2013-06-26 (×3): 1 via ORAL
  Filled 2013-06-25 (×2): qty 1

## 2013-06-25 MED ORDER — OXYCODONE-ACETAMINOPHEN 5-325 MG PO TABS
1.0000 | ORAL_TABLET | ORAL | Status: DC | PRN
  Administered 2013-06-25 – 2013-06-26 (×3): 2 via ORAL
  Filled 2013-06-25 (×3): qty 2

## 2013-06-25 NOTE — Evaluation (Addendum)
Physical Therapy Evaluation Patient Details Name: Brendan Mosley MRN: [id number] DOB: 1950-07-06 Today's Date: 06/25/2013 Time: 7829-5621 PT Time Calculation (min): 37 min  PT Assessment / Plan / Recommendation History of Present Illness  Brendan Mosley is a 62 y.o. male with history of rectal cancer currently undergoing radiation and chemotherapy.  Patient presents with generalized weakness and confusion that was noted by girlfriend.  She states she had been trying to keep him hydrated and keep his electrolytes up as best as she could but she is concerned that he has become dehydrated. Pt also has  perineaum/buttock radiation burns , also affecting palms and feet.  Clinical Impression  Discussed with pt possible option for SNF for rehab and nursing care. Pt declines. Pt will need a rolling walker, BSC, HHPT. Pt will benefit from PT to address problems listed below.    PT Assessment  Patient needs continued PT services    Follow Up Recommendations  Home health PT;Supervision/Assistance - 24 hour    Does the patient have the potential to tolerate intense rehabilitation      Barriers to Discharge Decreased caregiver support      Equipment Recommendations  Rolling walker with 5" wheels;3in1 (PT)    Recommendations for Other Services     Frequency Min 3X/week    Precautions / Restrictions Precautions Precautions: Fall Precaution Comments: periarea skin llesions  fromb radiation burns. "PROTECTIVE PRECAUTIONS"(do not walk pt into hall)   Pertinent Vitals/Pain Periarea, 7. RN in to give meds.      Mobility  Bed Mobility Bed Mobility: Not assessed Transfers Transfers: Sit to Stand;Stand to Sit Sit to Stand: 4: Min guard;From chair/3-in-1;With upper extremity assist Stand to Sit: 4: Min guard;To chair/3-in-1;With upper extremity assist Details for Transfer Assistance: urgency to sit down due to need for BM Ambulation/Gait Ambulation/Gait Assistance: 4: Min guard Ambulation  Distance (Feet): 20 Feet Assistive device: Rolling walker Ambulation/Gait Assistance Details: limited walking due to need for BM urgently. Gait Pattern: Step-through pattern    Exercises     PT Diagnosis: Difficulty walking;Generalized weakness;Acute pain  PT Problem List: Decreased activity tolerance;Decreased mobility;Decreased skin integrity;Decreased safety awareness;Decreased knowledge of precautions;Decreased knowledge of use of DME;Pain PT Treatment Interventions: DME instruction;Gait training;Functional mobility training;Therapeutic activities;Therapeutic exercise;Patient/family education     PT Goals(Current goals can be found in the care plan section) Acute Rehab PT Goals Patient Stated Goal: I want to go home. PT Goal Formulation: With patient Time For Goal Achievement: 07/09/13 Potential to Achieve Goals: Good  Visit Information  Last PT Received On: 06/25/13 Assistance Needed: +1 History of Present Illness: Brendan Mosley is a 62 y.o. male with history of rectal cancer currently undergoing radiation and chemotherapy.  Patient presents with generalized weakness and confusion that was noted by girlfriend.  She states she had been trying to keep him hydrated and keep his electrolytes up as best as she could but she is concerned that he has become dehydrated. Pt also has  perineaum/buttock radiation burns , also affecting palms and feet.       Prior Functioning  Home Living Family/patient expects to be discharged to:: Private residence Living Arrangements: Alone;Spouse/significant other Available Help at Discharge: Family Type of Home: House Home Access: Stairs to enter Secretary/administrator of Steps: 2 Home Layout: One level Home Equipment: None Additional Comments: pt vague in answering questions. Prior Function Level of Independence: Independent Communication Communication: No difficulties    Cognition  Cognition Arousal/Alertness: Awake/alert Behavior During  Therapy: WFL for tasks assessed/performed Overall Cognitive  Status: Within Functional Limits for tasks assessed (? decreased awareness of severity of his skin condition and need for assistance at discharge.)    Extremity/Trunk Assessment Upper Extremity Assessment Upper Extremity Assessment: Generalized weakness;RUE deficits/detail;LUE deficits/detail RUE Deficits / Details: peeling skin of palms LUE Deficits / Details: same as R Lower Extremity Assessment Lower Extremity Assessment: Generalized weakness;LLE deficits/detail;RLE deficits/detail RLE Deficits / Details: cool to touch feet, noted mottled skin LLE Deficits / Details: same as R Cervical / Trunk Assessment Cervical / Trunk Assessment: Normal   Balance    End of Session PT - End of Session Activity Tolerance: Treatment limited secondary to medical complications (Comment) (BM urgency.) Patient left: with call bell/phone within reach (in bathroom) Nurse Communication: Mobility status  GP     Rada Hay 06/25/2013, 4:09 PM Blanchard Kelch PT [phone]

## 2013-06-25 NOTE — Progress Notes (Addendum)
IP PROGRESS NOTE  Subjective:   He is up in a chair. He continues to have pain at the perineum. He request a different narcotic. The diarrhea has improved.  Objective: Vital signs in last 24 hours: Blood pressure 114/85, pulse 101, temperature 98.1 F (36.7 C), temperature source Oral, resp. rate 18, height 5\' 8"  (1.727 m), weight 123 lb (55.792 kg), SpO2 100.00%.  Intake/Output from previous day: 12/29 0701 - 12/30 0700 In: 1370 [P.O.:720; I.V.:650] Out: 100 [Urine:100]  Physical Exam:  HEENT: no thrush or ulcers Lungs: clear bilaterally  Skin: Hyperpigmentation with dry desquamation at the palms. desquamation at the perineum, scrotum, and penis covered with cream    Lab Results:  Recent Labs  06/23/13 0447 06/24/13 0714  WBC 2.3* 2.4*  HGB 10.0* 9.7*  HCT 30.1* 28.0*  PLT 231 189    BMET  Recent Labs  06/24/13 0714 06/25/13 0422  NA 129* 134*  K 4.0 4.4  CL 103 107  CO2 17* 16*  GLUCOSE 87 82  BUN 11 13  CREATININE 0.85 0.90  CALCIUM 7.2* 7.1*    Studies/Results: No results found.  Medications: I have reviewed the patient's current medications.  Assessment/Plan:  1. Rectal cancer, clinical stage IV, presenting with a rectal mass, perirectal lymphadenopathy and a hypermetabolic liver lesion. Initiation of concurrent Xeloda and radiation on 05/06/2013.  Xeloda placed on hold beginning 06/06/2013 due to diarrhea, hand-foot syndrome and skin breakdown at the scrotum/perineum. Radiation completed 06/14/2013 2. Pain/constipation/rectal bleeding secondary to #1. Improvement in the "rectal" pain since beginning treatment 3. History of hypertension. 4. Hand/foot syndrome secondary to capecitabine.improved 5. Anemia. Iron deficiency. 6. Skin breakdown at the scrotum and perineum. 7. "Diarrhea".potentially related to Xeloda, radiation. C. difficile toxin assay negative this admission 8. Oral candidiasis 06/06/2013. He completed a course of  Diflucan. 9. Weight loss.  10. Malnutrition 11. Dehydration   he appears improved. He continues to have significant pain related to skin breakdown at the scrotum and perineum. The diarrhea is likely related to Xeloda induced enteritis. This has improved.  Recommendations: 1. Continue intravenous hydration, nutrition supplements, and antidiarrheals 2. I will add an oral narcotic for breakthrough pain 3. Supportive care for the skin breakdown 4. outpatient restaging CT evaluation as scheduled  I will be out until 07/02/2013. Please call oncology as needed.    LOS: 5 days   Jahira Swiss  06/25/2013, 3:02 PM

## 2013-06-25 NOTE — Progress Notes (Signed)
NUTRITION FOLLOW UP  Intervention:   Recommend addition of MVI    Recommend to supplement with Carnation Instant Breakfast BID   Continue to Monitor magnesium, potassium, and phosphorus daily for at least 3 days, MD to replete as needed, as pt is at risk for refeeding syndrome given ongoing severe malnutrition.  Continue Resource Breeze po daily, each supplement provides 250 kcal and 9 grams of protein.   RD to continue to follow nutrition care plan.   Nutrition Dx:   Inadequate oral intake related to catabolic illness as evidenced by estimated needs  Goal:   Pt to meet >/= 90% of their estimated nutrition needs    Monitor:   Total protein/energy intake, Phos trends, GI profile, Weights, Labs, Skin integrity  Assessment:   12/28: Patient reports tolerating meals with fair intake (about 50%). Drinking [employer]. Patient had not other needs at this time.   12/30: -Phos improving, Mag and K WNL -Receiving Phos 500 mg orally BID and K through continuous IVFs. Recommend to MD to continue to replete -Slowly improving loose stools, negative for C.diff -Will recommend addition of MVI to assist in nutrient replenishment  -Pt reported receiving Resource Breeze only once. Was willing to try additional supplementation, requested [employer]. Noted "food was delicious", appetite was slightly improving, has PO intake of approximately 50%  Height: Ht Readings from Last 1 Encounters:  06/22/13 5\' 8"  (1.727 m)    Weight Status:   Wt Readings from Last 1 Encounters:  06/22/13 123 lb (55.792 kg)    Estimated Nutritional Needs:  Kcal: 1680 - 1800  Protein: at least 84 grams daily  Fluid: 1.7 - 2 liters   Skin: radiation dermatitis and MASD to buttocks   Diet Order: [id number], Resource BID   Intake/Output Summary (Last 24 hours) at 06/25/13 1231 Last data filed at 06/25/13 0600  Gross per 24 hour  Intake   1130 ml  Output    100 ml  Net   1030 ml     Last BM: 12/29   Labs:   Recent Labs Lab 06/23/13 0447 06/24/13 0714 06/25/13 0422  NA 129* 129* 134*  K 3.2* 4.0 4.4  CL 101 103 107  CO2 18* 17* 16*  BUN 9 11 13   CREATININE 0.81 0.85 0.90  CALCIUM 7.0* 7.2* 7.1*  MG 2.3 2.0 1.8  PHOS 1.2* 1.4* 2.2*  GLUCOSE 87 87 82    CBG (last 3)   Recent Labs  06/24/13 1649 06/24/13 2111 06/25/13 0802  GLUCAP 78 93 82    Scheduled Meds: . antiseptic oral rinse  15 mL Mouth Rinse q12n4p  . chlorhexidine  15 mL Mouth Rinse BID  . cholestyramine  4 g Oral BID  . feeding supplement (RESOURCE BREEZE)  1 Container Oral Q24H  . ferrous sulfate  325 mg Oral BID WC  . fluconazole  100 mg Oral Daily  . heparin  5,000 Units Subcutaneous Q8H  . insulin aspart  0-9 Units Subcutaneous TID WC  . OxyCODONE  15 mg Oral Q12H  . phosphorus  500 mg Oral BID    Continuous Infusions: . 0.9 % NaCl with KCl 20 mEq / L 125 mL/hr at 06/25/13 8295    Lloyd Huger MS RD LDN Clinical Dietitian [phone]

## 2013-06-25 NOTE — Progress Notes (Signed)
Pt had a BM in the bed. This RN was assisting pt with cleaning his bottom. Pt had rolled to stomach, and began to slide to the floor feet first. Pt landed on the floor on his knees and feet. Pt denied injury and was assisted back to the bed. MD notified. This RN to continue to monitor. Eugene Garnet RN

## 2013-06-25 NOTE — Progress Notes (Signed)
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  -repeat UA--no pyuria  -d/c Bactrim as previous UA did not show pyuria  -suspect skin contamination in previous urine culture  Rectal cancer- per Dr. Truett Perna  Diarrhea  -C diff negative -likely due to Xeloda.  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  Code Status: full  Family Communication: spoke with son at beside today; called son yesterday - [phone] 12/27  Dispo--home 12/31 if stable        Procedures/Studies: Ct Head Wo Contrast  06/20/2013   CLINICAL DATA:  Altered mental status.  EXAM: CT HEAD WITHOUT CONTRAST  TECHNIQUE: Contiguous axial images were obtained from the base of the skull through the vertex without intravenous contrast.  COMPARISON:  None.  FINDINGS: No mass lesion, mass effect, midline shift, hydrocephalus, hemorrhage. No territorial ischemia or acute infarction. Periventricular white matter disease is present, most likely chronic ischemic. Paranasal sinuses demonstrate a right maxillary mucous retention cyst/ polyp. Mastoid air cells are clear. Scattered mucous retention cyst/ polyps in the right sphenoid and right ethmoid air cells.  IMPRESSION: No acute  intracranial abnormality. 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   Electronically Signed   By: Andreas Newport M.D.   On: 06/20/2013 21:49         Subjective: Patient is feeling better. He denies any fevers, chills, chest pain, shortness breath, nausea, vomiting. His diarrhea is improving. He has more substance to the stool. Pain is improved when he is given pain medications.  Objective: Filed Vitals:   06/24/13 2117 06/25/13 0426 06/25/13 1400 06/25/13 1749  BP: 114/85 135/85 125/75 122/75  Pulse: 101 105 99 103  Temp: 98.1 F (36.7 C) 98.7 F (37.1 C) 97.9 F (36.6 C) 97.6 F (36.4 C)  TempSrc: Oral Oral Oral Oral  Resp: 18  18 18   Height:      Weight:      SpO2: 100% 100% 100% 100%    Intake/Output Summary (Last 24 hours) at 06/25/13 1956 Last data filed at 06/25/13 1400  Gross per 24 hour  Intake    720 ml  Output    100 ml  Net    620 ml   Weight change:  Exam:   General:  Pt is alert, follows commands appropriately, not in acute distress  HEENT: No icterus, No thrush, [place]/AT  Cardiovascular: RRR, S1/S2, no rubs, no gallops  Respiratory: CTA bilaterally, no wheezing, no crackles, no rhonchi  Abdomen: Soft/+BS, non tender, non distended, no guarding  Extremities: No edema, No lymphangitis, No petechiae, No rashes, no synovitis GU-multiple shallow ulcerations in the penile and scrotal areas without any necrosis or purulent   Data Reviewed: Basic Metabolic Panel:  Recent Labs Lab 06/21/13 0655 06/22/13 0425 06/23/13 0447 06/24/13 0714 06/25/13 0422  NA 127* 128* 129* 129* 134*  K 3.3* 2.8* 3.2* 4.0  4.4  CL 93* 98 101 103 107  CO2 22 20 18* 17* 16*  GLUCOSE 92 105* 87 87 82  BUN 25* 13 9 11 13   CREATININE 1.08 0.83 0.81 0.85 0.90  CALCIUM 7.0* 6.9* 7.0* 7.2* 7.1*  MG  --  2.1 2.3 2.0 1.8  PHOS  --  1.1* 1.2* 1.4* 2.2*   Liver Function Tests:  Recent Labs Lab 06/20/13 2204  AST 30  ALT 28  ALKPHOS 91  BILITOT 1.1   PROT 6.5  ALBUMIN 2.4*   No results found for this basename: LIPASE, AMYLASE,  in the last 168 hours No results found for this basename: AMMONIA,  in the last 168 hours CBC:  Recent Labs Lab 06/20/13 2204 06/21/13 0655 06/23/13 0447 06/24/13 0714  WBC 6.9 4.4 2.3* 2.4*  NEUTROABS 6.2  --   --   --   HGB 13.9 10.0* 10.0* 9.7*  HCT 40.4 29.7* 30.1* 28.0*  MCV 81.5 81.1 82.0 81.4  PLT PLATELET CLUMPS NOTED ON SMEAR, COUNT APPEARS ADEQUATE 277 231 189   Cardiac Enzymes: No results found for this basename: CKTOTAL, CKMB, CKMBINDEX, TROPONINI,  in the last 168 hours BNP: No components found with this basename: POCBNP,  CBG:  Recent Labs Lab 06/24/13 1649 06/24/13 2111 06/25/13 0802 06/25/13 1330 06/25/13 1738  GLUCAP 78 93 82 145* 98    Recent Results (from the past 240 hour(s))  CULTURE, BLOOD (ROUTINE X 2)     Status: None   Collection Time    06/20/13 11:33 PM      Result Value Range Status   Specimen Description BLOOD LEFT WRIST   Final   Special Requests BOTTLES DRAWN AEROBIC AND ANAEROBIC 5CC   Final   Culture  Setup Time     Final   Value: 06/21/2013 04:06     Performed at [facility]   Culture     Final   Value:        BLOOD CULTURE RECEIVED NO GROWTH TO DATE CULTURE WILL BE HELD FOR 5 DAYS BEFORE ISSUING A FINAL NEGATIVE REPORT     Performed at [facility]   Report Status PENDING   Incomplete  CULTURE, BLOOD (ROUTINE X 2)     Status: None   Collection Time    06/20/13 11:44 PM      Result Value Range Status   Specimen Description BLOOD RIGHT WRIST   Final   Special Requests BOTTLES DRAWN AEROBIC ONLY 5CC   Final   Culture  Setup Time     Final   Value: 06/21/2013 04:06     Performed at [facility]   Culture     Final   Value:        BLOOD CULTURE RECEIVED NO GROWTH TO DATE CULTURE WILL BE HELD FOR 5 DAYS BEFORE ISSUING A FINAL NEGATIVE REPORT     Performed at [facility]   Report Status PENDING   Incomplete  URINE  CULTURE     Status: None   Collection Time    06/21/13 12:02 AM      Result Value Range Status   Specimen Description URINE, CATHETERIZED   Final   Special Requests NONE   Final   Culture  Setup Time     Final   Value: 06/21/2013 04:08     Performed at [facility] Count     Final   Value: 50,000 COLONIES/ML     Performed  at [facility]     Final   Value: STAPHYLOCOCCUS AUREUS     Note: RIFAMPIN AND GENTAMICIN SHOULD NOT BE USED AS SINGLE DRUGS FOR TREATMENT OF STAPH INFECTIONS.     Performed at [facility]   Report Status 06/22/2013 FINAL   Final   Organism ID, Bacteria STAPHYLOCOCCUS AUREUS   Final  CLOSTRIDIUM DIFFICILE BY PCR     Status: None   Collection Time    06/22/13 12:11 AM      Result Value Range Status   C difficile by pcr NEGATIVE  NEGATIVE Final   Comment: Performed at [facility]  STOOL CULTURE     Status: None   Collection Time    06/22/13 12:11 AM      Result Value Range Status   Specimen Description STOOL   Final   Special Requests NONE   Final   Culture     Final   Value: NO SALMONELLA, SHIGELLA, CAMPYLOBACTER, YERSINIA, OR E.COLI 0157:H7 ISOLATED     Performed at [facility]   Report Status 06/25/2013 FINAL   Final     Scheduled Meds: . antiseptic oral rinse  15 mL Mouth Rinse q12n4p  . chlorhexidine  15 mL Mouth Rinse BID  . cholestyramine  4 g Oral BID  . feeding supplement (RESOURCE BREEZE)  1 Container Oral Q24H  . ferrous sulfate  325 mg Oral BID WC  . fluconazole  100 mg Oral Daily  . heparin  5,000 Units Subcutaneous Q8H  . insulin aspart  0-9 Units Subcutaneous TID WC  . multivitamin with minerals  1 tablet Oral Daily  . OxyCODONE  15 mg Oral Q12H  . phosphorus  500 mg Oral BID   Continuous Infusions: . 0.9 % NaCl with KCl 20 mEq / L 125 mL/hr at 06/25/13 0752     Brendan Machuca, DO  [facility] Pager [phone]  If 7PM-7AM, please contact  night-coverage [email or web address] Password [email or web address] 06/25/2013, 7:56 PM   LOS: 5 days

## 2013-06-26 LAB — BASIC METABOLIC PANEL
CO2: 16 mEq/L — ABNORMAL LOW (ref 19–32)
Calcium: 7 mg/dL — ABNORMAL LOW (ref 8.4–10.5)
GFR calc Af Amer: >90 mL/min (ref >90)
GFR calc non Af Amer: >90 mL/min (ref >90)
Sodium: 135 mEq/L — ABNORMAL LOW (ref 137–147)

## 2013-06-26 LAB — MAGNESIUM: Magnesium: 1.8 mg/dL (ref 1.5–2.5)

## 2013-06-26 LAB — URINE CULTURE
Colony Count: NO GROWTH
Culture: NO GROWTH

## 2013-06-26 LAB — CORTISOL-AM, BLOOD: Cortisol - AM: 17.9 ug/dL (ref 4.3–22.4)

## 2013-06-26 LAB — PHOSPHORUS: Phosphorus: 2.1 mg/dL — ABNORMAL LOW (ref 2.3–4.6)

## 2013-06-26 MED ORDER — K PHOS MONO-SOD PHOS DI & MONO 155-852-130 MG PO TABS: 500.0000 mg | ORAL_TABLET | Freq: Three times a day (TID) | ORAL | Status: AC

## 2013-06-26 MED ORDER — K PHOS MONO-SOD PHOS DI & MONO 155-852-130 MG PO TABS
500.0000 mg | ORAL_TABLET | Freq: Three times a day (TID) | ORAL | Status: DC
  Administered 2013-06-26 (×2): 500 mg via ORAL
  Filled 2013-06-26 (×3): qty 2

## 2013-06-26 NOTE — Progress Notes (Signed)
PHYSICAL THERAPY NOTE- Recommend HHPT safety eval, RW and 3-in-1 for home. Pt is currently on air overlay mattress and pt asks if he can get one. Pt declined to ambulate at this time . Case management aware of PT recs.Blanchard Kelch PT [phone]

## 2013-06-26 NOTE — Care Management Note (Signed)
Cm spoke with patient at the bedside concerning discharge planning. Pt offered choice for [facility] agencies. Per pt choice [facility] to provide [facility] upon discharge. Pt request RW and 3n1. [facility] DME liasion made aware of MD orders. Pt states resides with family who will assist in home care. Family to provide transportation home. No other barriers or needs identified at this time.    Roxy Manns Raneem Mendolia,MSN,RN [phone]

## 2013-06-26 NOTE — Discharge Summary (Signed)
Physician Discharge Summary  Brendan Mosley ZOX:[id number] DOB: [birth year] DOA: 06/20/2013  PCP: No PCP Per Patient  Admit date: 06/20/2013 Discharge date: 06/26/2013  Recommendations for Outpatient Follow-up:  1. Pt will need to follow up with PCP in 1 weeks post discharge 2. Please obtain BMP to evaluate electrolytes and kidney function 3. Please also check magnesium and phosphorus in one week 4. Please also check CBC to evaluate Hg and Hct levels 5. Followup with Dr. 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 Truett Perna  Diarrhea  -C diff negative  -likely due to Xeloda.  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  Code Status: full  Family Communication: spoke with son at beside today; called son yesterday - [phone] 12/27      Discharge Condition: Stable  Disposition: Discharge home  Diet: Low residue/dysphasia 3  Wt Readings from Last 3 Encounters:  06/22/13 55.792 kg (123 lb)  06/11/13 56.201 kg (123 lb 14.4 oz)  06/07/13 58.196 kg (128 lb 4.8 oz)    History of present illness:  62 year old male with history of rectal cancer with last treatment on June 14, 2013 of radiation and chemotherapy who presents with generalized weakness and confusion. The patient was noted by girlfriend to have difficulty getting out of the bathtub earlier today. He was only oriented to himself upon EMS arrival. Patient's denies any recent fevers. He has had diarrhea. Patient himself is oriented to time and person but not place. He is difficult to obtain history from secondary to mental status. Patient denies any shortness of breath or chest pain. They deny any fevers.     Consultants: Med Onc--Dr. Truett Perna  Discharge Exam: Filed Vitals:   06/25/13 2137  BP: 127/60  Pulse: 99  Temp: 97.4 F (36.3 C)  Resp: 16   Filed Vitals:   06/25/13 0426 06/25/13 1400 06/25/13 1749 06/25/13 2137  BP: 135/85 125/75 122/75 127/60  Pulse: 105 99 103 99  Temp: 98.7 F (37.1 C) 97.9 F (36.6 C) 97.6 F (36.4 C) 97.4 F (36.3 C)  TempSrc: Oral Oral Oral Oral  Resp:  18 18 16   Height:      Weight:      SpO2: 100% 100% 100% 100%   General: A&O x 3, NAD, pleasant, cooperative Cardiovascular: RRR, no rub, no gallop, no S3 Respiratory: CTAB, no wheeze, no rhonchi Abdomen:soft, nontender, nondistended, positive bowel sounds Extremities: No edema, No lymphangitis, no petechiae GU-multiple shallow ulcerations in the  penile and scrotal areas without any necrosis or purulent   Discharge Instructions      Discharge Orders   Future Appointments Provider Department Dept Phone   06/28/2013 2:00 PM [facility] Only [facility] [phone]   06/28/2013 2:30 PM [facility]-Ct 2 [facility] [phone]   Liquids only 4 hours prior to your exam. Any medications can be taken as usual. Please arrive 15 min prior to your scheduled exam time.   07/02/2013 12:15 PM Rana Snare, NP [facility] [phone]   07/18/2013 10:30 AM Jonna Coup, MD  [facility] [phone]   Future Orders Complete By Expires   Diet - low sodium heart healthy  As directed    Increase activity slowly  As directed        Medication List    STOP taking these medications       capecitabine 500 MG tablet  Commonly known as:  XELODA     docusate sodium 100 MG capsule  Commonly known as:  COLACE      TAKE these medications       diphenoxylate-atropine 2.5-0.025 MG per tablet  Commonly known as:  LOMOTIL  Take 2 tablets by mouth 4 (four) times daily as needed for diarrhea or loose stools.     ferrous sulfate 325 (65 FE) MG tablet  Take 325 mg by mouth 2 (two) times daily with a meal.     lidocaine 2 % jelly  Commonly known as:  XYLOCAINE JELLY  Apply 1 application topically as needed.     loperamide 2 MG capsule  Commonly known as:  IMODIUM  Take 2 mg by mouth daily as needed for diarrhea or loose stools.     neomycin-bacitracin-polymyxin ointment  Commonly known as:  NEOSPORIN  Apply 1 application topically 4 (four) times daily as needed for wound care. apply to eye     oxyCODONE-acetaminophen 7.5-325 MG per tablet  Commonly known as:  PERCOCET  Take 1-2 tablets by mouth every 4 (four) hours as needed for pain.     OXYCONTIN 15 mg T12a 12 hr tablet  Generic drug:  OxyCODONE  Take 15 mg by mouth every 12 (twelve) hours.     phosphorus 155-852-130 MG tablet  Commonly known as:  K PHOS NEUTRAL  Take 2 tablets (500 mg total) by mouth 3 (three) times daily.     silver sulfADIAZINE 1 % cream  Commonly known as:  SILVADENE  Apply 1 application topically daily.         The results of significant  diagnostics from this hospitalization (including imaging, microbiology, ancillary and laboratory) are listed below for reference.    Significant Diagnostic Studies: Ct Head Wo Contrast  06/20/2013   CLINICAL DATA:  Altered mental status.  EXAM: CT HEAD WITHOUT CONTRAST  TECHNIQUE: Contiguous axial images were obtained from the base of the skull through the vertex without intravenous contrast.  COMPARISON:  None.  FINDINGS: No mass lesion, mass effect, midline shift, hydrocephalus, hemorrhage. No territorial ischemia or acute infarction. Periventricular white matter disease is present, most likely chronic ischemic. Paranasal sinuses demonstrate a right maxillary mucous retention cyst/ polyp. Mastoid air cells are clear. Scattered mucous retention cyst/ polyps in the right sphenoid and right ethmoid air cells.  IMPRESSION: No acute intracranial abnormality. 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   Electronically Signed   By: Charolette Child.D.  On: 06/20/2013 21:49     Microbiology: Recent Results (from the past 240 hour(s))  CULTURE, BLOOD (ROUTINE X 2)     Status: None   Collection Time    06/20/13 11:33 PM      Result Value Range Status   Specimen Description BLOOD LEFT WRIST   Final   Special Requests BOTTLES DRAWN AEROBIC AND ANAEROBIC 5CC   Final   Culture  Setup Time     Final   Value: 06/21/2013 04:06     Performed at [facility]   Culture     Final   Value:        BLOOD CULTURE RECEIVED NO GROWTH TO DATE CULTURE WILL BE HELD FOR 5 DAYS BEFORE ISSUING A FINAL NEGATIVE REPORT     Performed at [facility]   Report Status PENDING   Incomplete  CULTURE, BLOOD (ROUTINE X 2)     Status: None   Collection Time    06/20/13 11:44 PM      Result Value Range Status   Specimen Description BLOOD RIGHT WRIST   Final   Special Requests BOTTLES DRAWN AEROBIC ONLY 5CC   Final   Culture  Setup Time     Final   Value: 06/21/2013 04:06     Performed at  [facility]   Culture     Final   Value:        BLOOD CULTURE RECEIVED NO GROWTH TO DATE CULTURE WILL BE HELD FOR 5 DAYS BEFORE ISSUING A FINAL NEGATIVE REPORT     Performed at [facility]   Report Status PENDING   Incomplete  URINE CULTURE     Status: None   Collection Time    06/21/13 12:02 AM      Result Value Range Status   Specimen Description URINE, CATHETERIZED   Final   Special Requests NONE   Final   Culture  Setup Time     Final   Value: 06/21/2013 04:08     Performed at [facility] Count     Final   Value: 50,000 COLONIES/ML     Performed at [facility]   Culture     Final   Value: STAPHYLOCOCCUS AUREUS     Note: RIFAMPIN AND GENTAMICIN SHOULD NOT BE USED AS SINGLE DRUGS FOR TREATMENT OF STAPH INFECTIONS.     Performed at [facility]   Report Status 06/22/2013 FINAL   Final   Organism ID, Bacteria STAPHYLOCOCCUS AUREUS   Final  CLOSTRIDIUM DIFFICILE BY PCR     Status: None   Collection Time    06/22/13 12:11 AM      Result Value Range Status   C difficile by pcr NEGATIVE  NEGATIVE Final   Comment: Performed at [facility]  STOOL CULTURE     Status: None   Collection Time    06/22/13 12:11 AM      Result Value Range Status   Specimen Description STOOL   Final   Special Requests NONE   Final   Culture     Final   Value: NO SALMONELLA, SHIGELLA, CAMPYLOBACTER, YERSINIA, OR E.COLI 0157:H7 ISOLATED     Performed at [facility]   Report Status 06/25/2013 FINAL   Final  URINE CULTURE     Status: None   Collection Time    06/25/13  1:35 AM      Result Value Range Status   Specimen Description  URINE, CLEAN CATCH   Final   Special Requests NONE   Final   Culture  Setup Time     Final   Value: 06/25/2013 09:00     Performed at [facility] Count     Final   Value: NO GROWTH     Performed at [facility]   Culture     Final   Value: NO GROWTH     Performed at [facility]   Report Status 06/26/2013 FINAL   Final     Labs: Basic Metabolic Panel:  Recent Labs Lab 06/22/13 0425 06/23/13 0447 06/24/13 0714 06/25/13 0422 06/26/13 0420  NA 128* 129* 129* 134* 135*  K 2.8* 3.2* 4.0 4.4 4.0  CL 98 101 103 107 109  CO2 20 18* 17* 16* 16*  GLUCOSE 105* 87 87 82 85  BUN 13 9 11 13 10   CREATININE 0.83 0.81 0.85 0.90 0.83  CALCIUM 6.9* 7.0* 7.2* 7.1* 7.0*  MG 2.1 2.3 2.0 1.8 1.8  PHOS 1.1* 1.2* 1.4* 2.2* 2.1*   Liver Function Tests:  Recent Labs Lab 06/20/13 2204  AST 30  ALT 28  ALKPHOS 91  BILITOT 1.1  PROT 6.5  ALBUMIN 2.4*   No results found for this basename: LIPASE, AMYLASE,  in the last 168 hours No results found for this basename: AMMONIA,  in the last 168 hours CBC:  Recent Labs Lab 06/20/13 2204 06/21/13 0655 06/23/13 0447 06/24/13 0714  WBC 6.9 4.4 2.3* 2.4*  NEUTROABS 6.2  --   --   --   HGB 13.9 10.0* 10.0* 9.7*  HCT 40.4 29.7* 30.1* 28.0*  MCV 81.5 81.1 82.0 81.4  PLT PLATELET CLUMPS NOTED ON SMEAR, COUNT APPEARS ADEQUATE 277 231 189   Cardiac Enzymes: No results found for this basename: CKTOTAL, CKMB, CKMBINDEX, TROPONINI,  in the last 168 hours BNP: No components found with this basename: POCBNP,  CBG:  Recent Labs Lab 06/24/13 2111 06/25/13 0802 06/25/13 1330 06/25/13 1738 06/25/13 2133  GLUCAP 93 82 145* 98 94    Time coordinating discharge:  Greater than 30 minutes  Signed:  Reiley Bertagnolli, DO [facility] Pager: [phone] 06/26/2013, 7:52 AM

## 2013-06-27 LAB — CULTURE, BLOOD (ROUTINE X 2)
Culture: NO GROWTH
Culture: NO GROWTH

## 2013-06-28 ENCOUNTER — Other Ambulatory Visit: Payer: BC Managed Care – PPO

## 2013-06-28 ENCOUNTER — Encounter (HOSPITAL_COMMUNITY): Payer: Self-pay

## 2013-06-28 ENCOUNTER — Ambulatory Visit (HOSPITAL_COMMUNITY)
Admission: RE | Admit: 2013-06-28 | Discharge: 2013-06-28 | Disposition: A | Discharge status: Home / Self Care | Payer: BC Managed Care – PPO | Source: Ambulatory Visit | Attending: Nurse Practitioner | Admitting: Nurse Practitioner

## 2013-06-28 ENCOUNTER — Ambulatory Visit (HOSPITAL_COMMUNITY): Admission: RE | Admit: 2013-06-28 | Payer: BC Managed Care – PPO | Source: Ambulatory Visit

## 2013-06-28 DIAGNOSIS — Z79899 Other long term (current) drug therapy: Secondary | ICD-10-CM | POA: Insufficient documentation

## 2013-06-28 DIAGNOSIS — K6389 Other specified diseases of intestine: Secondary | ICD-10-CM | POA: Insufficient documentation

## 2013-06-28 DIAGNOSIS — J90 Pleural effusion, not elsewhere classified: Secondary | ICD-10-CM | POA: Insufficient documentation

## 2013-06-28 DIAGNOSIS — C787 Secondary malignant neoplasm of liver and intrahepatic bile duct: Secondary | ICD-10-CM | POA: Insufficient documentation

## 2013-06-28 DIAGNOSIS — Z923 Personal history of irradiation: Secondary | ICD-10-CM | POA: Insufficient documentation

## 2013-06-28 DIAGNOSIS — R609 Edema, unspecified: Secondary | ICD-10-CM | POA: Insufficient documentation

## 2013-06-28 DIAGNOSIS — C20 Malignant neoplasm of rectum: Secondary | ICD-10-CM | POA: Insufficient documentation

## 2013-06-28 DIAGNOSIS — N400 Enlarged prostate without lower urinary tract symptoms: Secondary | ICD-10-CM | POA: Insufficient documentation

## 2013-06-28 DIAGNOSIS — R599 Enlarged lymph nodes, unspecified: Secondary | ICD-10-CM | POA: Insufficient documentation

## 2013-06-28 MED ORDER — IOHEXOL 300 MG/ML  SOLN
100.0000 mL | Freq: Once | INTRAMUSCULAR | Status: AC | PRN
  Administered 2013-06-28: 100 mL via INTRAVENOUS

## 2013-06-28 NOTE — Progress Notes (Signed)
Discharge summary sent to payer through [facility]  

## 2013-06-30 ENCOUNTER — Inpatient Hospital Stay (HOSPITAL_COMMUNITY)
Admission: EM | Admit: 2013-06-30 | Discharge: 2013-07-28 | DRG: 393 | Disposition: E | Payer: BC Managed Care – PPO | Attending: Internal Medicine | Admitting: Internal Medicine

## 2013-06-30 ENCOUNTER — Emergency Department (HOSPITAL_COMMUNITY): Payer: BC Managed Care – PPO

## 2013-06-30 ENCOUNTER — Encounter (HOSPITAL_COMMUNITY): Payer: Self-pay | Admitting: Emergency Medicine

## 2013-06-30 ENCOUNTER — Inpatient Hospital Stay (HOSPITAL_COMMUNITY): Payer: BC Managed Care – PPO

## 2013-06-30 DIAGNOSIS — R609 Edema, unspecified: Secondary | ICD-10-CM | POA: Diagnosis present

## 2013-06-30 DIAGNOSIS — Z681 Body mass index (BMI) 19 or less, adult: Secondary | ICD-10-CM

## 2013-06-30 DIAGNOSIS — D638 Anemia in other chronic diseases classified elsewhere: Secondary | ICD-10-CM | POA: Diagnosis present

## 2013-06-30 DIAGNOSIS — E119 Type 2 diabetes mellitus without complications: Secondary | ICD-10-CM | POA: Diagnosis present

## 2013-06-30 DIAGNOSIS — Z66 Do not resuscitate: Secondary | ICD-10-CM | POA: Diagnosis not present

## 2013-06-30 DIAGNOSIS — R197 Diarrhea, unspecified: Secondary | ICD-10-CM

## 2013-06-30 DIAGNOSIS — C20 Malignant neoplasm of rectum: Secondary | ICD-10-CM

## 2013-06-30 DIAGNOSIS — I10 Essential (primary) hypertension: Secondary | ICD-10-CM | POA: Diagnosis present

## 2013-06-30 DIAGNOSIS — D630 Anemia in neoplastic disease: Secondary | ICD-10-CM | POA: Diagnosis present

## 2013-06-30 DIAGNOSIS — E876 Hypokalemia: Secondary | ICD-10-CM

## 2013-06-30 DIAGNOSIS — Z79899 Other long term (current) drug therapy: Secondary | ICD-10-CM

## 2013-06-30 DIAGNOSIS — K6289 Other specified diseases of anus and rectum: Principal | ICD-10-CM | POA: Diagnosis present

## 2013-06-30 DIAGNOSIS — Z9221 Personal history of antineoplastic chemotherapy: Secondary | ICD-10-CM

## 2013-06-30 DIAGNOSIS — N529 Male erectile dysfunction, unspecified: Secondary | ICD-10-CM | POA: Diagnosis present

## 2013-06-30 DIAGNOSIS — K627 Radiation proctitis: Secondary | ICD-10-CM

## 2013-06-30 DIAGNOSIS — Z85048 Personal history of other malignant neoplasm of rectum, rectosigmoid junction, and anus: Secondary | ICD-10-CM

## 2013-06-30 DIAGNOSIS — Y842 Radiological procedure and radiotherapy as the cause of abnormal reaction of the patient, or of later complication, without mention of misadventure at the time of the procedure: Secondary | ICD-10-CM | POA: Diagnosis present

## 2013-06-30 DIAGNOSIS — E860 Dehydration: Secondary | ICD-10-CM

## 2013-06-30 DIAGNOSIS — E43 Unspecified severe protein-calorie malnutrition: Secondary | ICD-10-CM

## 2013-06-30 DIAGNOSIS — C801 Malignant (primary) neoplasm, unspecified: Secondary | ICD-10-CM

## 2013-06-30 DIAGNOSIS — R64 Cachexia: Secondary | ICD-10-CM | POA: Diagnosis present

## 2013-06-30 DIAGNOSIS — L988 Other specified disorders of the skin and subcutaneous tissue: Secondary | ICD-10-CM | POA: Diagnosis present

## 2013-06-30 DIAGNOSIS — Z8249 Family history of ischemic heart disease and other diseases of the circulatory system: Secondary | ICD-10-CM

## 2013-06-30 DIAGNOSIS — Z7401 Bed confinement status: Secondary | ICD-10-CM

## 2013-06-30 LAB — URINALYSIS, ROUTINE W REFLEX MICROSCOPIC
Bilirubin Urine: NEGATIVE
Glucose, UA: NEGATIVE mg/dL
Ketones, ur: NEGATIVE mg/dL
Leukocytes, UA: NEGATIVE
Nitrite: NEGATIVE
Protein, ur: NEGATIVE mg/dL
Specific Gravity, Urine: 1.008 (ref 1.005–1.030)
Urobilinogen, UA: 0.2 mg/dL (ref 0.0–1.0)
pH: 6 (ref 5.0–8.0)

## 2013-06-30 LAB — CBC WITH DIFFERENTIAL/PLATELET
Basophils Absolute: 0 10*3/uL (ref 0.0–0.1)
Basophils Relative: 0 % (ref 0–1)
Eosinophils Absolute: 0 10*3/uL (ref 0.0–0.7)
Eosinophils Relative: 0 % (ref 0–5)
HCT: 27.6 % — ABNORMAL LOW (ref 39.0–52.0)
Hemoglobin: 9.6 g/dL — ABNORMAL LOW (ref 13.0–17.0)
Lymphocytes Relative: 7 % — ABNORMAL LOW (ref 12–46)
Lymphs Abs: 0.4 10*3/uL — ABNORMAL LOW (ref 0.7–4.0)
MCH: 27.1 pg (ref 26.0–34.0)
MCHC: 34.8 g/dL (ref 30.0–36.0)
MCV: 78 fL (ref 78.0–100.0)
Monocytes Absolute: 0.1 10*3/uL (ref 0.1–1.0)
Monocytes Relative: 3 % (ref 3–12)
Neutro Abs: 4.6 10*3/uL (ref 1.7–7.7)
Neutrophils Relative %: 90 % — ABNORMAL HIGH (ref 43–77)
Platelets: 144 10*3/uL — ABNORMAL LOW (ref 150–400)
RBC: 3.54 MIL/uL — ABNORMAL LOW (ref 4.22–5.81)
RDW: 20.2 % — ABNORMAL HIGH (ref 11.5–15.5)
WBC: 5.1 10*3/uL (ref 4.0–10.5)

## 2013-06-30 LAB — COMPREHENSIVE METABOLIC PANEL
ALT: 31 U/L (ref 0–53)
AST: 32 U/L (ref 0–37)
Albumin: 1.5 g/dL — ABNORMAL LOW (ref 3.5–5.2)
Alkaline Phosphatase: 126 U/L — ABNORMAL HIGH (ref 39–117)
BUN: 10 mg/dL (ref 6–23)
CO2: 21 mEq/L (ref 19–32)
Calcium: 7.4 mg/dL — ABNORMAL LOW (ref 8.4–10.5)
Chloride: 101 mEq/L (ref 96–112)
Creatinine, Ser: 0.83 mg/dL (ref 0.50–1.35)
GFR calc Af Amer: >90 mL/min (ref >90)
GFR calc non Af Amer: >90 mL/min (ref >90)
Glucose, Bld: 92 mg/dL (ref 70–99)
Potassium: 2.2 mEq/L — CL (ref 3.7–5.3)
Sodium: 136 mEq/L — ABNORMAL LOW (ref 137–147)
Total Bilirubin: 0.3 mg/dL (ref 0.3–1.2)
Total Protein: 4.8 g/dL — ABNORMAL LOW (ref 6.0–8.3)

## 2013-06-30 LAB — TROPONIN I: Troponin I: <0.3 ng/mL (ref <0.30)

## 2013-06-30 LAB — URINE MICROSCOPIC-ADD ON

## 2013-06-30 MED ORDER — OXYCODONE-ACETAMINOPHEN 7.5-325 MG PO TABS: 1.0000 | ORAL_TABLET | ORAL | Status: DC | PRN

## 2013-06-30 MED ORDER — POTASSIUM CHLORIDE 10 MEQ/100ML IV SOLN
10.0000 meq | INTRAVENOUS | Status: AC
  Administered 2013-06-30 (×2): 10 meq via INTRAVENOUS
  Filled 2013-06-30 (×3): qty 100

## 2013-06-30 MED ORDER — ONDANSETRON HCL 4 MG/2ML IJ SOLN: 4.0000 mg | Freq: Four times a day (QID) | INTRAMUSCULAR | Status: DC | PRN

## 2013-06-30 MED ORDER — FERROUS SULFATE 325 (65 FE) MG PO TABS
325.0000 mg | ORAL_TABLET | Freq: Two times a day (BID) | ORAL | Status: DC
  Administered 2013-07-01 – 2013-07-03 (×6): 325 mg via ORAL
  Filled 2013-06-30 (×9): qty 1

## 2013-06-30 MED ORDER — OXYCODONE-ACETAMINOPHEN 5-325 MG PO TABS
1.0000 | ORAL_TABLET | ORAL | Status: DC | PRN
  Administered 2013-07-01: 2 via ORAL
  Administered 2013-07-01: 1 via ORAL
  Administered 2013-07-01: 2 via ORAL
  Administered 2013-07-02: 1 via ORAL
  Administered 2013-07-03 (×3): 2 via ORAL
  Filled 2013-06-30: qty 2
  Filled 2013-06-30: qty 1
  Filled 2013-06-30 (×3): qty 2
  Filled 2013-06-30: qty 1
  Filled 2013-06-30: qty 2

## 2013-06-30 MED ORDER — ZOLPIDEM TARTRATE 5 MG PO TABS
5.0000 mg | ORAL_TABLET | Freq: Every evening | ORAL | Status: DC | PRN
  Administered 2013-07-02: 5 mg via ORAL
  Filled 2013-06-30: qty 1

## 2013-06-30 MED ORDER — OXYCODONE HCL ER 15 MG PO T12A
15.0000 mg | EXTENDED_RELEASE_TABLET | Freq: Two times a day (BID) | ORAL | Status: DC
  Administered 2013-06-30 – 2013-07-03 (×6): 15 mg via ORAL
  Filled 2013-06-30 (×7): qty 1

## 2013-06-30 MED ORDER — SILVER SULFADIAZINE 1 % EX CREA
1.0000 "application " | TOPICAL_CREAM | Freq: Every day | CUTANEOUS | Status: DC
  Administered 2013-07-01 – 2013-07-03 (×3): 1 via TOPICAL
  Filled 2013-06-30: qty 50
  Filled 2013-06-30: qty 85

## 2013-06-30 MED ORDER — ENOXAPARIN SODIUM 40 MG/0.4ML ~~LOC~~ SOLN
40.0000 mg | Freq: Every day | SUBCUTANEOUS | Status: DC
  Administered 2013-06-30 – 2013-07-02 (×3): 40 mg via SUBCUTANEOUS
  Filled 2013-06-30 (×5): qty 0.4

## 2013-06-30 MED ORDER — BACITRACIN-NEOMYCIN-POLYMYXIN 400-5-5000 EX OINT: 1.0000 "application " | TOPICAL_OINTMENT | Freq: Four times a day (QID) | CUTANEOUS | Status: DC | PRN

## 2013-06-30 MED ORDER — POTASSIUM CHLORIDE 10 MEQ/100ML IV SOLN
10.0000 meq | INTRAVENOUS | Status: AC
  Administered 2013-06-30 – 2013-07-01 (×3): 10 meq via INTRAVENOUS
  Filled 2013-06-30 (×3): qty 100

## 2013-06-30 MED ORDER — LIDOCAINE HCL 2 % EX GEL
1.0000 "application " | CUTANEOUS | Status: DC | PRN
  Filled 2013-06-30: qty 5

## 2013-06-30 MED ORDER — HYDROMORPHONE HCL PF 1 MG/ML IJ SOLN
1.0000 mg | INTRAMUSCULAR | Status: DC | PRN
  Administered 2013-06-30: 1 mg via INTRAVENOUS
  Filled 2013-06-30: qty 1

## 2013-06-30 MED ORDER — K PHOS MONO-SOD PHOS DI & MONO 155-852-130 MG PO TABS
500.0000 mg | ORAL_TABLET | Freq: Three times a day (TID) | ORAL | Status: DC
  Administered 2013-06-30 – 2013-07-03 (×9): 500 mg via ORAL
  Filled 2013-06-30 (×13): qty 2

## 2013-06-30 MED ORDER — OXYCODONE HCL 5 MG PO TABS
2.5000 mg | ORAL_TABLET | ORAL | Status: DC | PRN
  Administered 2013-07-01 – 2013-07-03 (×8): 5 mg via ORAL
  Filled 2013-06-30 (×9): qty 1

## 2013-06-30 MED ORDER — HYDROCORTISONE 2.5 % RE CREA
TOPICAL_CREAM | Freq: Two times a day (BID) | RECTAL | Status: DC
  Administered 2013-06-30 – 2013-07-03 (×6): via RECTAL
  Filled 2013-06-30 (×3): qty 28.35

## 2013-06-30 MED ORDER — DIPHENOXYLATE-ATROPINE 2.5-0.025 MG PO TABS
2.0000 | ORAL_TABLET | Freq: Four times a day (QID) | ORAL | Status: DC | PRN
  Administered 2013-06-30 – 2013-07-01 (×2): 2 via ORAL
  Filled 2013-06-30 (×2): qty 2

## 2013-06-30 MED ORDER — ONDANSETRON HCL 4 MG PO TABS: 4.0000 mg | ORAL_TABLET | Freq: Four times a day (QID) | ORAL | Status: DC | PRN

## 2013-06-30 NOTE — H&P (Addendum)
[facility]                                                                                    Patient Demographics  Brendan Mosley, is a 63 y.o. male  CSN: [id number]  MRN: [id number]  DOB - March 22, 1951  Admit Date - 07/10/2013  Outpatient Primary MD for the patient is No PCP Per Patient   With History of -  Past Medical History  Diagnosis Date  . Hemorrhoids   . ED (erectile dysfunction)   . Hypertension   . Cancer 04/05/13    rectal  . DM (diabetes mellitus)       History reviewed. No pertinent past surgical history.  in for   Chief Complaint  Patient presents with  . Foot Swelling     HPI  Brendan Mosley  is a 63 y.o. male, with past medical history significant for rectal cancer status post chemotherapy and radiation x5 weeks presented today for  swelling in the lower extremities for the last 2 weeks and diarrhea x5 weeks. Patient denies abdominal pains but complains of increased pain in his rectum patient denies chest pains or complaints of shortness of breath even at rest. No fever chills no headache or dizziness, patient feels very weak.    Review of Systems    In addition to the HPI above,  No Fever-chills, No Headache, No changes with Vision or hearing, No problems swallowing food or Liquids, No Chest pain, Cough or Shortness of Breath, No Abdominal pain, No Nausea or Vommitting,  No Blood in stool or Urine, No dysuria, No new skin rashes or bruises, No new joints pains-aches,  No new weakness, tingling, numbness in any extremity, No recent weight gain or loss, No polyuria, polydypsia or polyphagia, No significant Mental Stressors.  A full 10 point Review of Systems was done, except as stated above, all other Review of Systems were negative.   Social History History  Substance Use Topics  . Smoking status: Never Smoker   . Smokeless tobacco: Never Used  . Alcohol Use: No     Family History Family History  Problem Relation  Age of Onset  . Hypertension Father      Prior to Admission medications   Medication Sig Start Date End Date Taking? Authorizing Provider  diphenoxylate-atropine (LOMOTIL) 2.5-0.025 MG per tablet Take 2 tablets by mouth 4 (four) times daily as needed for diarrhea or loose stools.   Yes Historical Provider, MD  ferrous sulfate 325 (65 FE) MG tablet Take 325 mg by mouth 2 (two) times daily with a meal.   Yes Historical Provider, MD  lidocaine (XYLOCAINE JELLY) 2 % jelly Apply 1 application topically as needed. 05/27/13  Yes Marye Round, MD  loperamide (IMODIUM) 2 MG capsule Take 2 mg by mouth daily as needed for diarrhea or loose stools.   Yes Historical Provider, MD  neomycin-bacitracin-polymyxin (NEOSPORIN) ointment Apply 1 application topically 4 (four) times daily as needed for wound care. apply to eye   Yes Historical Provider, MD  OxyCODONE (OXYCONTIN) 15 mg T12A 12 hr tablet Take 15 mg by mouth every 12 (twelve) hours.   Yes Historical Provider,  MD  oxyCODONE-acetaminophen (PERCOCET) 7.5-325 MG per tablet Take 1-2 tablets by mouth every 4 (four) hours as needed for pain.   Yes Historical Provider, MD  phosphorus (K PHOS NEUTRAL) 155-852-130 MG tablet Take 2 tablets (500 mg total) by mouth 3 (three) times daily. 06/26/13  Yes Orson Eva, MD  silver sulfADIAZINE (SILVADENE) 1 % cream Apply 1 application topically daily. 06/07/13  Yes Historical Provider, MD    No Known Allergies  Physical Exam  Vitals  Blood pressure 102/58, pulse 97, temperature 97.3 F (36.3 C), temperature source Oral, resp. rate 21, SpO2 95.00%.   1. General African American male, chronically ill , looks very tired.  2. Normal affect and insight, Not Suicidal or Homicidal, Awake Alert, Oriented X 3.  3. No F.N deficits, ALL C.Nerves Intact, Strength 5/5 all 4 extremities, Sensation intact all 4 extremities, Plantars down going.  4. Ears and Eyes appear Normal, Conjunctivae clear, PERRLA. Moist Oral  Mucosa.  5. Supple Neck, No JVD, No cervical lymphadenopathy appriciated, No Carotid Bruits.  6. Symmetrical Chest wall movement, Good air movement bilaterally, CTAB.  7. RRR, No Gallops, Rubs or Murmurs, No Parasternal Heave.  8. Positive Bowel Sounds, Abdomen Soft, Non tender, No organomegaly appriciated,No rebound -guarding or rigidity.  9.  No Cyanosis, Normal Skin Turgor, No Skin Rash or Bruise.  10. Good muscle tone,  joints appear normal , increased edema +2 in the lower extremities  11. No Palpable Lymph Nodes in Neck or Axillae    Data Review  CBC  Recent Labs Lab 06/24/13 0714 07/03/2013 1639  WBC 2.4* 5.1  HGB 9.7* 9.6*  HCT 28.0* 27.6*  PLT 189 144*  MCV 81.4 78.0  MCH 28.2 27.1  MCHC 34.6 34.8  RDW 20.7* 20.2*  LYMPHSABS  --  0.4*  MONOABS  --  0.1  EOSABS  --  0.0  BASOSABS  --  0.0   ------------------------------------------------------------------------------------------------------------------  Chemistries   Recent Labs Lab 06/24/13 0714 06/25/13 0422 06/26/13 0420 07/24/2013 1639  NA 129* 134* 135* 136*  K 4.0 4.4 4.0 2.2*  CL 103 107 109 101  CO2 17* 16* 16* 21  GLUCOSE 87 82 85 92  BUN 11 13 10 10   CREATININE 0.85 0.90 0.83 0.83  CALCIUM 7.2* 7.1* 7.0* 7.4*  MG 2.0 1.8 1.8  --   AST  --   --   --  32  ALT  --   --   --  31  ALKPHOS  --   --   --  126*  BILITOT  --   --   --  0.3   ------------------------------------------------------------------------------------------------------------------ Cardiac Enzymes  Recent Labs Lab 07/08/2013 1639  TROPONINI <0.30     ---------------------------------------------------------------------------------------------------------------  Urinalysis    Component Value Date/Time   COLORURINE YELLOW 07/05/2013 1808   APPEARANCEUR CLEAR 07/08/2013 1808   LABSPEC 1.008 07/12/2013 1808   PHURINE 6.0 07/03/2013 1808   GLUCOSEU NEGATIVE 07/23/2013 1808   HGBUR TRACE* 07/10/2013 1808   BILIRUBINUR  NEGATIVE 06/29/2013 1808   KETONESUR NEGATIVE 07/25/2013 1808   PROTEINUR NEGATIVE 07/18/2013 1808   UROBILINOGEN 0.2 07/07/2013 1808   NITRITE NEGATIVE 07/12/2013 1808   LEUKOCYTESUR NEGATIVE 07/17/2013 1808    ----------------------------------------------------------------------------------------------------------------   Imaging results:   Dg Chest 2 View  07/09/2013   CLINICAL DATA:  Weakness and chest pain for 1 week, history of rectal cancer  EXAM: CHEST  2 VIEW  COMPARISON:  None.  FINDINGS: Heart size and vascular pattern are normal. Lungs  are clear. 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  IMPRESSION: No acute findings   Electronically Signed   By: Skipper Cliche M.D.   On: 07/24/2013 17:14   Ct Chest W Contrast  06/28/2013   CLINICAL DATA:  Followup metastatic rectal carcinoma. Ongoing chemotherapy and radiation therapy.  EXAM: CT CHEST, ABDOMEN, AND PELVIS WITH CONTRAST  TECHNIQUE: Multidetector CT imaging of the chest, abdomen and pelvis was performed following the standard protocol during bolus administration of intravenous contrast.  CONTRAST:  119mL OMNIPAQUE IOHEXOL 300 MG/ML  SOLN  COMPARISON:  04/05/2013  FINDINGS: CT CHEST FINDINGS  No evidence of mediastinal or hilar masses. No adenopathy seen elsewhere within the thorax. A new small left pleural effusion is seen. Mild patchy areas of airspace disease are seen in both upper lobes which are new, and may be related drug reaction, although infection cannot definitely be excluded. No discrete pulmonary nodules or masses are identified.  CT ABDOMEN AND PELVIS FINDINGS  Diffuse body wall and mesenteric edema noted which is new since previous study. Concentric rectal wall thickening again seen, consistent with mass. Left perirectal lymphadenopathy measuring 1.6 x 2.1 cm on image 111 shows no significant change. No other lymphadenopathy identified within the abdomen or pelvis.  Hypovascular metastasis in the anterior segment of the  right hepatic lobe currently measures 1.3 x 1.4 cm on image 48 compared with 1.9 x 2.3 cm previously. No other liver lesions are identified.  The pancreas, spleen, adrenal glands, and kidneys are normal in appearance. No evidence hydronephrosis. Enlarged prostate again noted. No suspicious bone lesions identified for  IMPRESSION: No significant change in concentric rectal mass and mild left perirectal lymphadenopathy.  Decreased size of metastatic lesion in the anterior right hepatic lobe. No new or progressive metastatic disease identified.  Increased diffuse body wall and mesenteric edema.  New small left pleural effusion and patchy bilateral upper lobe airspace disease, suspicious for drug reaction with infection considered less likely.   Electronically Signed   By: Earle Gell M.D.   On: 06/28/2013 15:57   Ct Abdomen Pelvis W Contrast  06/28/2013   CLINICAL DATA:  Followup metastatic rectal carcinoma. Ongoing chemotherapy and radiation therapy.  EXAM: CT CHEST, ABDOMEN, AND PELVIS WITH CONTRAST  TECHNIQUE: Multidetector CT imaging of the chest, abdomen and pelvis was performed following the standard protocol during bolus administration of intravenous contrast.  CONTRAST:  132mL OMNIPAQUE IOHEXOL 300 MG/ML  SOLN  COMPARISON:  04/05/2013  FINDINGS: CT CHEST FINDINGS  No evidence of mediastinal or hilar masses. No adenopathy seen elsewhere within the thorax. A new small left pleural effusion is seen. Mild patchy areas of airspace disease are seen in both upper lobes which are new, and may be related drug reaction, although infection cannot definitely be excluded. No discrete pulmonary nodules or masses are identified.  CT ABDOMEN AND PELVIS FINDINGS  Diffuse body wall and mesenteric edema noted which is new since previous study. Concentric rectal wall thickening again seen, consistent with mass. Left perirectal lymphadenopathy measuring 1.6 x 2.1 cm on image 111 shows no significant change. No other  lymphadenopathy identified within the abdomen or pelvis.  Hypovascular metastasis in the anterior segment of the right hepatic lobe currently measures 1.3 x 1.4 cm on image 48 compared with 1.9 x 2.3 cm previously. No other liver lesions are identified.  The pancreas, spleen, adrenal glands, and kidneys are normal in appearance. No evidence hydronephrosis. Enlarged prostate again noted. No suspicious bone lesions identified for  IMPRESSION: No significant change in concentric rectal mass and mild left perirectal lymphadenopathy.  Decreased size of metastatic lesion in the anterior right hepatic lobe. No new or progressive metastatic disease identified.  Increased diffuse body wall and mesenteric edema.  New small left pleural effusion and patchy bilateral upper lobe airspace disease, suspicious for drug reaction with infection considered less likely.   Electronically Signed   By: Earle Gell M.D.   On: 06/28/2013 15:57    My personal review of EKG: Normal sinus rhythm with nonspecific T-wave changes probably due to hypokalemia    Assessment & Plan  1. 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 difficile pending   Consider GI or oncology consult in a.m. 2. Severe hypokalemia secondary to 1   Will give IV potassium to make it total of 6    Check potassium level at 2 AM     check magnesium level 3. Shortness of breath; chest CT is pending 4. Lower extremity edema   Third spacing due to to malnutrition 5. Hypertension 6. Diabetes mellitus       ISS    DVT Prophylaxis Lovenox  AM Labs Ordered, also please review Full Orders  Family Communication: Admission, patients condition and plan of care including tests being ordered have been discussed with the patient who indicates understanding and agrees with the plan and Code Status.  Code Status full  Disposition Plan: Home  Time spent in minutes : 36  minutes  Condition GUARDED

## 2013-06-30 NOTE — ED Notes (Signed)
Bed: MG86 Expected date: 06/29/2013 Expected time: 3:02 PM Means of arrival: Ambulance Comments: Swollen feet CA pt

## 2013-06-30 NOTE — ED Notes (Addendum)
Patient complains that his feet have been swollen for a 2 weeks, he has cancer and needs to be admitted. He also states that he has had diarrhea for 2 weeks. He complains of pain to his feet and buttocks.

## 2013-06-30 NOTE — Progress Notes (Signed)
Patient has had several incontinent episodes of stool. Buttocks is escoriated.

## 2013-07-01 ENCOUNTER — Encounter (HOSPITAL_COMMUNITY): Payer: Self-pay

## 2013-07-01 DIAGNOSIS — E860 Dehydration: Secondary | ICD-10-CM

## 2013-07-01 DIAGNOSIS — E43 Unspecified severe protein-calorie malnutrition: Secondary | ICD-10-CM | POA: Insufficient documentation

## 2013-07-01 LAB — BASIC METABOLIC PANEL
BUN: 9 mg/dL (ref 6–23)
CALCIUM: 6.8 mg/dL — AB (ref 8.4–10.5)
CO2: 21 meq/L (ref 19–32)
CREATININE: 0.76 mg/dL (ref 0.50–1.35)
Chloride: 104 mEq/L (ref 96–112)
GFR calc Af Amer: >90 mL/min (ref >90)
GFR calc non Af Amer: >90 mL/min (ref >90)
GLUCOSE: 90 mg/dL (ref 70–99)
Potassium: 2.5 mEq/L — CL (ref 3.7–5.3)
Sodium: 137 mEq/L (ref 137–147)

## 2013-07-01 LAB — POTASSIUM
Potassium: 2.5 mEq/L — CL (ref 3.7–5.3)
Potassium: 3.2 mEq/L — ABNORMAL LOW (ref 3.7–5.3)

## 2013-07-01 LAB — MAGNESIUM: Magnesium: 1.7 mg/dL (ref 1.5–2.5)

## 2013-07-01 LAB — CLOSTRIDIUM DIFFICILE BY PCR: Toxigenic C. Difficile by PCR: NEGATIVE

## 2013-07-01 MED ORDER — VITAMINS A & D EX OINT
TOPICAL_OINTMENT | CUTANEOUS | Status: AC
  Administered 2013-07-01: 1
  Filled 2013-07-01: qty 5

## 2013-07-01 MED ORDER — IOHEXOL 350 MG/ML SOLN
80.0000 mL | Freq: Once | INTRAVENOUS | Status: AC | PRN
  Administered 2013-07-01: 80 mL via INTRAVENOUS

## 2013-07-01 MED ORDER — VITAMINS A & D EX OINT
TOPICAL_OINTMENT | CUTANEOUS | Status: AC
  Administered 2013-07-01: 01:00:00
  Filled 2013-07-01: qty 5

## 2013-07-01 MED ORDER — POTASSIUM CHLORIDE 10 MEQ/100ML IV SOLN
10.0000 meq | INTRAVENOUS | Status: AC
  Administered 2013-07-01 (×3): 10 meq via INTRAVENOUS
  Filled 2013-07-01 (×3): qty 100

## 2013-07-01 MED ORDER — HYDROMORPHONE HCL PF 2 MG/ML IJ SOLN
1.5000 mg | INTRAMUSCULAR | Status: DC | PRN
  Administered 2013-07-01 (×2): 1.5 mg via INTRAVENOUS
  Administered 2013-07-01: 0.5 mg via INTRAVENOUS
  Administered 2013-07-01 – 2013-07-03 (×10): 1.5 mg via INTRAVENOUS
  Administered 2013-07-03: 0.5 mg via INTRAVENOUS
  Administered 2013-07-03: 1.5 mg via INTRAVENOUS
  Filled 2013-07-01 (×15): qty 1

## 2013-07-01 MED ORDER — POTASSIUM CHLORIDE 10 MEQ/100ML IV SOLN
10.0000 meq | INTRAVENOUS | Status: AC
  Administered 2013-07-01 (×6): 10 meq via INTRAVENOUS
  Filled 2013-07-01 (×6): qty 100

## 2013-07-01 NOTE — Care Management Note (Signed)
   CARE MANAGEMENT NOTE 07/01/2013  Patient:  Brendan Mosley, Brendan Mosley   Account Number:  [id number]  Date Initiated:  07/01/2013  Documentation initiated by:  Ardine Iacovelli  Subjective/Objective Assessment:   63 yo male admitted with hypokalemia.     Action/Plan:   Home when stable   Anticipated DC Date:     Anticipated DC Plan:        [place]  CM consult      Choice offered to / List presented to:  NA   DME arranged  NA      DME agency  NA     [place] arranged  HH-1 RN      [place].   Status of service:  In process, will continue to follow Medicare Important Message given?   (If response is "NO", the following Medicare IM given date fields will be blank) Date Medicare IM given:   Date Additional Medicare IM given:    Discharge Disposition:    Per UR Regulation:  Reviewed for med. necessity/level of care/duration of stay  If discussed at [place] of Stay Meetings, dates discussed:    Comments:  07/01/13 [place] [phone] UR complete. Pt previously discharged home with [facility] services. Awaiting PT/OT eval to reassess dc needs. Cm to provide pt with PCP information within network.

## 2013-07-01 NOTE — ED Provider Notes (Signed)
CSN: [id number]     Arrival date & time 07/19/2013  1511 History   First MD Initiated Contact with Patient 07/11/2013 1533     Chief Complaint  Patient presents with  . Foot Swelling   (Consider location/radiation/quality/duration/timing/severity/associated sxs/prior Treatment) HPI Patient presents emergency department with weakness, and lower extremity swelling, over the last few weeks.  The patient, states he is also had diarrhea over the last 6 weeks.  Patient, states, that he's had skin breakdown on his buttocks due to the diarrhea.  Patient is being treated for rectal cancer with radiation and therapy.  Patient denies chest pain, shortness of breath, headache, blurred vision, fever, weakness, nausea, or vomiting.  Patient, states nothing seems to make his condition, better or worse Past Medical History  Diagnosis Date  . Hemorrhoids   . ED (erectile dysfunction)   . Hypertension   . Cancer 04/05/13    rectal  . DM (diabetes mellitus)    History reviewed. No pertinent past surgical history. Family History  Problem Relation Age of Onset  . Hypertension Father    History  Substance Use Topics  . Smoking status: Never Smoker   . Smokeless tobacco: Never Used  . Alcohol Use: No    Review of Systems All other systems negative except as documented in the HPI. All pertinent positives and negatives as reviewed in the HPI. Allergies  Review of patient's allergies indicates no known allergies.  Home Medications  No current outpatient prescriptions on file. BP 128/75  Pulse 94  Temp(Src) 98.2 F (36.8 C) (Oral)  Resp 23  Ht 5\' 7"  (1.702 m)  Wt 117 lb 4.6 oz (53.2 kg)  BMI 18.37 kg/m2  SpO2 100% Physical Exam  Nursing note and vitals reviewed. Constitutional: He appears well-developed and well-nourished. No distress.  HENT:  Head: Normocephalic and atraumatic.  Mouth/Throat: Oropharynx is clear and moist.  Eyes: Pupils are equal, round, and reactive to light.  Neck: Normal  range of motion. Neck supple.  Cardiovascular: Normal rate, regular rhythm and normal heart sounds.  Exam reveals no gallop and no friction rub.   No murmur heard. Pulmonary/Chest: Effort normal and breath sounds normal. No respiratory distress.  Genitourinary:     Skin: Skin is warm and dry. No rash noted. No erythema.    ED Course  Procedures (including critical care time) Labs Review Labs Reviewed  CBC WITH DIFFERENTIAL - Abnormal; Notable for the following:    RBC 3.54 (*)    Hemoglobin 9.6 (*)    HCT 27.6 (*)    RDW 20.2 (*)    Platelets 144 (*)    Neutrophils Relative % 90 (*)    Lymphocytes Relative 7 (*)    Lymphs Abs 0.4 (*)    All other components within normal limits  COMPREHENSIVE METABOLIC PANEL - Abnormal; Notable for the following:    Sodium 136 (*)    Potassium 2.2 (*)    Calcium 7.4 (*)    Total Protein 4.8 (*)    Albumin 1.5 (*)    Alkaline Phosphatase 126 (*)    All other components within normal limits  URINALYSIS, ROUTINE W REFLEX MICROSCOPIC - Abnormal; Notable for the following:    Hgb urine dipstick TRACE (*)    All other components within normal limits  CLOSTRIDIUM DIFFICILE BY PCR  TROPONIN I  URINE MICROSCOPIC-ADD ON  POTASSIUM  MAGNESIUM  BASIC METABOLIC PANEL   Imaging Review Dg Chest 2 View  07/05/2013   CLINICAL DATA:  Weakness  and chest pain for 1 week, history of rectal cancer  EXAM: CHEST  2 VIEW  COMPARISON:  None.  FINDINGS: Heart size and vascular pattern are normal. Lungs are clear. 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  IMPRESSION: No acute findings   Electronically Signed   By: Skipper Cliche M.D.   On: 07/14/2013 17:14    EKG Interpretation    Date/Time:    Ventricular Rate:    PR Interval:    QRS Duration:   QT Interval:    QTC Calculation:   R Axis:     Text Interpretation:              MDM   1. Hypokalemia   2. Cancer   3. Diarrhea   4. Malignant neoplasm of rectum      Aisha be  admitted to the hospital for further evaluation and care.  Patient is hypokalemia and is being treated for this.  Told of the plan and spoke with the, [place], [place] 07/01/13 [phone]

## 2013-07-01 NOTE — Progress Notes (Signed)
At 0830 pt. Was found to have rectal tube fallen out on the bed.  MD informed and stated pt. May leave out.  Pt. Stated he would like to attempt one more placement of a rectal tube.  Tube was attempted to be place without success. Will continue to monitor.

## 2013-07-01 NOTE — Progress Notes (Signed)
Subjective: 63 year old gentleman with history of rectal cancer status post radiation last of which was 2 days ago admitted with persistent diarrhea. Patient complain of pain in his rectum at this point. He had about 12 episodes of diarrhea yesterday but only 3 times so far today. No fever or chills. His diarrhea has been evaluated for C. difficile and is negative. He has no vomiting associated with the diarrhea. Patient is bedridden since admission and cachectic. Able to eat and drink without a problem. Has not had any rectal bleed this time around. He is profoundly malnourished however.  Objective: Vital signs in last 24 hours: Temp:  [97.3 F (36.3 C)-98.2 F (36.8 C)] 98.2 F (36.8 C) (01/05 0550) Pulse Rate:  [91-97] 91 (01/05 0550) Resp:  [16-23] 16 (01/05 0550) BP: (102-136)/(58-81) 136/81 mmHg (01/05 0550) SpO2:  [95 %-100 %] 97 % (01/05 0550) Weight:  [53.2 kg (117 lb 4.6 oz)] 53.2 kg (117 lb 4.6 oz) (01/04 2221) Weight change:  Last BM Date: 07/26/2013  Intake/Output from previous day: 01/04 0701 - 01/05 0700 In: 1598 [P.O.:240; IV Piggyback:400] Out: 575 [Urine:575] Intake/Output this shift:    General appearance: alert, cooperative, cachectic and no distress Eyes: conjunctivae/corneas clear. PERRL, EOM's intact. Fundi benign. Neck: no adenopathy, no carotid bruit, no JVD, supple, symmetrical, trachea midline and thyroid not enlarged, symmetric, no tenderness/mass/nodules Back: symmetric, no curvature. ROM normal. No CVA tenderness. Resp: clear to auscultation bilaterally Chest wall: no tenderness Cardio: regular rate and rhythm, S1, S2 normal, no murmur, click, rub or gallop GI: soft, non-tender; bowel sounds normal; no masses,  no organomegaly Extremities: extremities normal, atraumatic, no cyanosis or edema Pulses: 2+ and symmetric Lymph nodes: Cervical, supraclavicular, and axillary nodes normal.  Lab Results:  Recent Labs  06/28/2013 1639  WBC 5.1  HGB 9.6*  HCT  27.6*  PLT 144*   BMET  Recent Labs  07/02/2013 1639 07/01/13 0127 07/01/13 0830  NA 136* 137  --   K 2.2* 2.5*  2.5* 3.2*  CL 101 104  --   CO2 21 21  --   GLUCOSE 92 90  --   BUN 10 9  --   CREATININE 0.83 0.76  --   CALCIUM 7.4* 6.8*  --     Studies/Results: Dg Chest 2 View  07/27/2013   CLINICAL DATA:  Weakness and chest pain for 1 week, history of rectal cancer  EXAM: CHEST  2 VIEW  COMPARISON:  None.  FINDINGS: Heart size and vascular pattern are normal. Lungs are clear. 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  IMPRESSION: No acute findings   Electronically Signed   By: Skipper Cliche M.D.   On: 07/09/2013 17:14   Ct Angio Chest Pe W/cm &/or Wo Cm  07/01/2013   CLINICAL DATA:  Weakness in extremity swelling. Rule out pulmonary embolism  EXAM: CT ANGIOGRAPHY CHEST WITH CONTRAST  TECHNIQUE: Multidetector CT imaging of the chest was performed using the standard protocol during bolus administration of intravenous contrast. Multiplanar CT image reconstructions including MIPs were obtained to evaluate the vascular anatomy.  CONTRAST:  18mL OMNIPAQUE IOHEXOL 350 MG/ML SOLN  COMPARISON:  06/28/2013  FINDINGS: THORACIC INLET/BODY WALL:  Paucity of fat.  MEDIASTINUM:  Normal heart size. No pericardial effusion. No acute vascular abnormality, including pulmonary embolism. There is occasional respiratory motion which limits assessment at these levels. No adenopathy.  LUNG WINDOWS:  There is new peripheral ground-glass densities in the upper lungs. Small left pleural effusion with atelectatic changes  to the left lower lobe.  UPPER ABDOMEN:  Hepatic metastatic disease, reference recent staging CT. Fatty infiltration of the liver.  OSSEOUS:  No acute fracture. No suspicious lytic or blastic lesions. Advanced glenohumeral osteoarthritis.  Review of the MIP images confirms the above findings.  IMPRESSION: 1. Negative for pulmonary embolism. 2. New infectious or inflammatory opacities in  the bilateral upper lobes. The opacities are not typical for bacterial pneumonia, atypical pathogen should be considered. 3. Small left pleural effusion.   Electronically Signed   By: Jorje Guild M.D.   On: 07/01/2013 02:01    Medications: I have reviewed the patient's current medications.  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 Also has hypokalemia malnutrition and dehydration.  #1 radiation proctitis: Patient will be treated conservatively. His diarrhea has slowed down already. Continue with Lomotil and hydration to avoid dehydration. If symptoms persist will get GI consult.  #2 severe protein Calorie Malnutrition: Nutrition has been consulted. We will look at trying to get patient more protein. With his cancer however in treatment at this point we may have to add Megace to stimulate his appetite.  #3 hypokalemia: We will replete his potassium. His low potassium is most likely from the continued diarrhea so we will monitor closely.  #4 anemia of chronic disease: Most likely from his cancer. We will monitor his H&H closely.  #5 bilateral lower extremity edema: The most likely cause is his low protein. Looks like dependent edema mostly.   LOS: 1 day   Lennart Gladish,LAWAL 07/01/2013, 12:30 PM

## 2013-07-01 NOTE — Progress Notes (Signed)
Patient arrived to floor via Bed from ED around 2220pm.  Patient was in severe amount of pain w/extreme exoriation/burns from radiation on buttocks.  Patient was having continuous stools and being changed every few minutes.  Order for flexiseal received.  Patient was medicated with dilaudid and flexiseal was inserted w/ only 33ml of saline.  Rectal tube was irrigated and placement checked. Silvadene cream was applied to burn area and non-adherent pads placed on top.  Patient reports feeling a little more comfortable at this time with the tube.  Will continue to monitor.Azzie Glatter [place]

## 2013-07-01 NOTE — Progress Notes (Signed)
CRITICAL VALUE ALERT  Critical value received: K+ 2.5  Date of notification:  07/01/13 Time of notification:  0215  Critical value read back:Yes Nurse who received alert:  Azzie Glatter, RN  MD notified (1st page): On Call Hospitalist  Time of first page:  0215  MD notified (2nd page): N/A  Time of second page: N/A  Responding MD:  On Call [facility]  Time MD responded:  0220 (Orders received for 6 more rounds of potassium); Patient received 6 rounds prior to this order.

## 2013-07-01 NOTE — Progress Notes (Signed)
INITIAL NUTRITION ASSESSMENT  DOCUMENTATION CODES Per approved criteria  -Severe malnutrition in the context of chronic illness -Underweight  Pt meets criteria for severe MALNUTRITION in the context of chronic disease as evidenced by unintentional wt loss of 21% and <75% est nutrition needs for > 1 month.  INTERVENTION: -Recommend to add [employer] Breakfast TID and [employer] TID -Encouraged PO intake -Monitor electrolytes: Phos, Mag, K  NUTRITION DIAGNOSIS: Inadequate oral intake related to decreased appetite as evidenced by PO intake <75%, BMI < 18.5.   Goal: Pt to meet >/= 90% of their estimated nutrition needs   Monitor:  Diet order, Total protein/energy intake, GI profile, labs, weights  Reason for Assessment: Malnutrition Screening Tool/Underweight BMI  63 y.o. male  Admitting Dx: Hypokalemia  ASSESSMENT: Brendan Mosley is a 63 y.o. male, with past medical history significant for rectal cancer status post chemotherapy and radiation x5 weeks presented today for swelling in the lower extremities for the last 2 weeks and diarrhea x5 weeks. Patient denies abdominal pains but complains of increased pain in his rectum patient denies chest pains or complaints of shortness of breath even at rest. No fever chills no headache or dizziness, patient feels very weak  -Pt reported unintentional wt loss of approximately 50 lbs in past 3-4 months. Appetite declining, likely r/t to chemo/radiation and associated radiation diarrhea -Is consuming approximately 50% of meals. Would benefit from diet liberalization to encourage PO intake -Willing to try supplements. Trialed [employer] and [employer], both of which pt enjoyed.  -Hypokalemic. Mag and Phos WNL. Will continue to monitor d/t hx of refeeding syndrome  Height: Ht Readings from Last 1 Encounters:  07-17-13 5\' 7"  (1.702 m)    Weight: Wt Readings from Last 1 Encounters:  07-17-13 117 lb 4.6 oz (53.2 kg)     Ideal Body Weight: 148  % Ideal Body Weight: 79%  Wt Readings from Last 10 Encounters:  2013/07/17 117 lb 4.6 oz (53.2 kg)  06/22/13 123 lb (55.792 kg)  06/11/13 123 lb 14.4 oz (56.201 kg)  06/07/13 128 lb 4.8 oz (58.196 kg)  06/06/13 131 lb (59.421 kg)  05/30/13 139 lb 9.6 oz (63.322 kg)  05/27/13 135 lb 6.4 oz (61.417 kg)  05/20/13 141 lb 6.4 oz (64.139 kg)  05/17/13 147 lb 1.6 oz (66.724 kg)  05/14/13 144 lb 3.2 oz (65.409 kg)    Usual Body Weight: 150-160 lbs  % Usual Body Weight: 75  BMI:  Body mass index is 18.37 kg/(m^2).  Estimated Nutritional Needs: Kcal: 1700-1900  Protein: 80-90 gram Fluid: 1900 ml/daily  Skin: WDL, tenting  Diet Order: Cardiac  EDUCATION NEEDS: -No education needs identified at this time   Intake/Output Summary (Last 24 hours) at 07/01/13 0924 Last data filed at 07/01/13 0550  Gross per 24 hour  Intake   1598 ml  Output    575 ml  Net   1023 ml    Last BM: 1/04- post radiation diarrhea    Labs:   Recent Labs Lab 06/25/13 0422 06/26/13 0420 17-Jul-2013 0127 17-Jul-2013 1639 07/01/13 0127  NA 134* 135*  --  136* 137  K 4.4 4.0  --  2.2* 2.5*  2.5*  CL 107 109  --  101 104  CO2 16* 16*  --  21 21  BUN 13 10  --  10 9  CREATININE 0.90 0.83  --  0.83 0.76  CALCIUM 7.1* 7.0*  --  7.4* 6.8*  MG 1.8 1.8 1.7  --   --  PHOS 2.2* 2.1*  --   --   --   GLUCOSE 82 85  --  92 90    CBG (last 3)  No results found for this basename: GLUCAP,  in the last 72 hours  Scheduled Meds: . enoxaparin (LOVENOX) injection  40 mg Subcutaneous QHS  . ferrous sulfate  325 mg Oral BID PC  . hydrocortisone   Rectal BID  . OxyCODONE  15 mg Oral Q12H  . phosphorus  500 mg Oral TID  . silver sulfADIAZINE  1 application Topical Daily    Continuous Infusions:   Past Medical History  Diagnosis Date  . Hemorrhoids   . ED (erectile dysfunction)   . Hypertension   . Cancer 04/05/13    rectal  . DM (diabetes mellitus)     History reviewed.  No pertinent past surgical history.  Atlee Abide MS RD LDN Clinical Dietitian [phone]

## 2013-07-01 NOTE — Progress Notes (Signed)
[facility]  Patient Status: Active (receiving services up to time of hospitalization)  [facility] is providing the following services: RN and PT  If patient discharges after hours, please call [phone].   Brendan Mosley 07/01/2013, 4:14 PM

## 2013-07-02 ENCOUNTER — Ambulatory Visit: Payer: BC Managed Care – PPO | Admitting: Nurse Practitioner

## 2013-07-02 LAB — GLUCOSE, CAPILLARY: Glucose-Capillary: 109 mg/dL — ABNORMAL HIGH (ref 70–99)

## 2013-07-02 LAB — CBC WITH DIFFERENTIAL/PLATELET
Basophils Absolute: 0 10*3/uL (ref 0.0–0.1)
Basophils Relative: 1 % (ref 0–1)
Eosinophils Absolute: 0 10*3/uL (ref 0.0–0.7)
Eosinophils Relative: 0 % (ref 0–5)
HEMATOCRIT: 27.3 % — AB (ref 39.0–52.0)
Hemoglobin: 9.4 g/dL — ABNORMAL LOW (ref 13.0–17.0)
Lymphocytes Relative: 8 % — ABNORMAL LOW (ref 12–46)
Lymphs Abs: 0.3 10*3/uL — ABNORMAL LOW (ref 0.7–4.0)
MCH: 27.4 pg (ref 26.0–34.0)
MCHC: 34.4 g/dL (ref 30.0–36.0)
MCV: 79.6 fL (ref 78.0–100.0)
MONO ABS: 0.1 10*3/uL (ref 0.1–1.0)
Monocytes Relative: 2 % — ABNORMAL LOW (ref 3–12)
NEUTROS ABS: 2.9 10*3/uL (ref 1.7–7.7)
Neutrophils Relative %: 89 % — ABNORMAL HIGH (ref 43–77)
PLATELETS: 118 10*3/uL — AB (ref 150–400)
RBC: 3.43 MIL/uL — ABNORMAL LOW (ref 4.22–5.81)
RDW: 20.5 % — AB (ref 11.5–15.5)
WBC: 3.3 10*3/uL — ABNORMAL LOW (ref 4.0–10.5)

## 2013-07-02 LAB — BASIC METABOLIC PANEL
BUN: 11 mg/dL (ref 6–23)
CALCIUM: 7 mg/dL — AB (ref 8.4–10.5)
CHLORIDE: 102 meq/L (ref 96–112)
CO2: 22 meq/L (ref 19–32)
CREATININE: 0.84 mg/dL (ref 0.50–1.35)
GFR calc Af Amer: >90 mL/min (ref >90)
GFR calc non Af Amer: >90 mL/min (ref >90)
Glucose, Bld: 94 mg/dL (ref 70–99)
Potassium: 3.2 mEq/L — ABNORMAL LOW (ref 3.7–5.3)
Sodium: 137 mEq/L (ref 137–147)

## 2013-07-02 MED ORDER — FUROSEMIDE 10 MG/ML IJ SOLN
40.0000 mg | Freq: Two times a day (BID) | INTRAMUSCULAR | Status: DC
  Administered 2013-07-02 – 2013-07-03 (×4): 40 mg via INTRAVENOUS
  Filled 2013-07-02 (×7): qty 4

## 2013-07-02 MED ORDER — DIPHENOXYLATE-ATROPINE 2.5-0.025 MG/5ML PO LIQD: 5.0000 mL | Freq: Four times a day (QID) | ORAL | Status: DC

## 2013-07-02 MED ORDER — COLLAGENASE 250 UNIT/GM EX OINT
TOPICAL_OINTMENT | Freq: Every day | CUTANEOUS | Status: DC
  Administered 2013-07-02 – 2013-07-03 (×2): via TOPICAL
  Filled 2013-07-02: qty 30

## 2013-07-02 MED ORDER — HYDROCERIN EX CREA
TOPICAL_CREAM | Freq: Every day | CUTANEOUS | Status: DC
  Administered 2013-07-02 – 2013-07-03 (×2): via TOPICAL
  Filled 2013-07-02: qty 113

## 2013-07-02 MED ORDER — DIPHENOXYLATE-ATROPINE 2.5-0.025 MG PO TABS
1.0000 | ORAL_TABLET | Freq: Four times a day (QID) | ORAL | Status: DC
  Administered 2013-07-02 – 2013-07-03 (×7): 1 via ORAL
  Filled 2013-07-02 (×8): qty 1

## 2013-07-02 NOTE — Progress Notes (Signed)
PT Cancellation Note  Patient Details Name: Brendan Mosley MRN: [id number] DOB: 1950/07/20   Cancelled Treatment:    Reason Eval/Treat Not Completed: Pain limiting ability to participate. Pt reports severe pain and swelling in B feet. He was tremulous, moaning and stated he's not able to tolerate walking due to the pain. RN notified. Will re-attempt tomorrow.    Brendan Mosley 07/02/2013, 11:02 AM [phone]

## 2013-07-02 NOTE — Progress Notes (Signed)
[facility] PROGRESS NOTE  Brendan Mosley ZRA:[id number] DOB: 1951-04-21 DOA: Jul 12, 2013 PCP: No PCP Per Patient  Brief narrative: 63 year old male with history of rectal cancer status post radiation who was admitted Jul 12, 2013 with persistent diarrhea and rectal pain. No fevers, no blood in stool. On admission, CBC revealed hemoglobin of 9.6 and platelet count of 144. BMP revealed sodium of 136 and normal creatinine.  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 - C.diff negative - start lomotil TID Active Problems:   Hypokalemia - due to GI losses - repleted    LE edema and necrotic toes - appreciated wound care consult - start lasix 40 mg IV BID   Protein-calorie malnutrition, severe - nutrition consulted   Code Status: full code Family Communication: no family at the bedside Disposition Plan: remains inpatient   Leisa Lenz, MD  [facility] Pager [phone]  If 7PM-7AM, please contact night-coverage [email or web address] Password [email or web address] 07/02/2013, 1:28 PM   LOS: 2 days   Consultants:  Wound care  Procedures:  None   Antibiotics:  None   HPI/Subjective: No acute overnight events.  Objective: Filed Vitals:   07/01/13 0550 07/01/13 1408 07/01/13 2225 07/02/13 0729  BP: 136/81 133/94 130/88 122/71  Pulse: 91 112 133 121  Temp: 98.2 F (36.8 C) 97.6 F (36.4 C) 97.2 F (36.2 C) 98.3 F (36.8 C)  TempSrc: Oral Oral Oral Oral  Resp: 16 16 20 20   Height:      Weight:      SpO2: 97% 95% 91% 92%    Intake/Output Summary (Last 24 hours) at 07/02/13 1328 Last data filed at 07/02/13 0900  Gross per 24 hour  Intake    380 ml  Output    750 ml  Net   -370 ml    Exam:   General:  Pt is alert, follows commands appropriately, not in acute distress  Cardiovascular: Regular rate and rhythm, S1/S2 appreciated   Respiratory: Clear to auscultation bilaterally, no wheezing, no crackles, no rhonchi  Abdomen: Soft, non  tender, non distended, bowel sounds present, no guarding  Extremities:pedal +3  Edema, necrotic toes, pulses DP and PT palpable bilaterally  Neuro: Grossly nonfocal  Data Reviewed: Basic Metabolic Panel:  Recent Labs Lab 06/26/13 0420 07-12-2013 0127 07-12-13 1639 07/01/13 0127 07/01/13 0830 07/02/13 0708  NA 135*  --  136* 137  --  137  K 4.0  --  2.2* 2.5*  2.5* 3.2* 3.2*  CL 109  --  101 104  --  102  CO2 16*  --  21 21  --  22  GLUCOSE 85  --  92 90  --  94  BUN 10  --  10 9  --  11  CREATININE 0.83  --  0.83 0.76  --  0.84  CALCIUM 7.0*  --  7.4* 6.8*  --  7.0*  MG 1.8 1.7  --   --   --   --   PHOS 2.1*  --   --   --   --   --    Liver Function Tests:  Recent Labs Lab 07-12-13 1639  AST 32  ALT 31  ALKPHOS 126*  BILITOT 0.3  PROT 4.8*  ALBUMIN 1.5*   No results found for this basename: LIPASE, AMYLASE,  in the last 168 hours No results found for this basename: AMMONIA,  in the last 168 hours CBC:  Recent Labs Lab 07-12-2013 1639 07/02/13 0708  WBC 5.1 3.3*  NEUTROABS 4.6 2.9  HGB 9.6* 9.4*  HCT 27.6* 27.3*  MCV 78.0 79.6  PLT 144* 118*   Cardiac Enzymes:  Recent Labs Lab 07-25-13 1639  TROPONINI <0.30   BNP: No components found with this basename: POCBNP,  CBG:  Recent Labs Lab 06/25/13 1738 06/25/13 2133 06/26/13 0817 06/26/13 1224 06/26/13 1647  GLUCAP 98 94 75 103* 76    Recent Results (from the past 240 hour(s))  URINE CULTURE     Status: None   Collection Time    06/25/13  1:35 AM      Result Value Range Status   Specimen Description URINE, CLEAN CATCH   Final   Special Requests NONE   Final   Culture  Setup Time     Final   Value: 06/25/2013 09:00     Performed at [place]     Final   Value: NO GROWTH     Performed at [facility]   Culture     Final   Value: NO GROWTH     Performed at [facility]   Report Status 06/26/2013 FINAL   Final  CLOSTRIDIUM DIFFICILE BY PCR      Status: None   Collection Time    2013-07-25  9:45 PM      Result Value Range Status   C difficile by pcr NEGATIVE  NEGATIVE Final   Comment: Performed at [facility]     Studies: Dg Chest 2 View 2013-07-25     IMPRESSION: No acute findings   Electronically Signed   By: Skipper Cliche M.D.   On: Jul 25, 2013 17:14   Ct Angio Chest Pe W/cm &/or Wo Cm  07/01/2013    IMPRESSION: 1. Negative for pulmonary embolism. 2. New infectious or inflammatory opacities in the bilateral upper lobes. The opacities are not typical for bacterial pneumonia, atypical pathogen should be considered. 3. Small left pleural effusion.   Electronically Signed   By: Jorje Guild M.D.   On: 07/01/2013 02:01    Scheduled Meds: . collagenase   Topical Daily  . diphenoxylate-atropine  1 tablet Oral QID  . enoxaparin (LOVENOX)   40 mg Subcutaneous QHS  . ferrous sulfate  325 mg Oral BID PC  . furosemide  40 mg Intravenous BID  . hydrocortisone   Rectal BID  . OxyCODONE  15 mg Oral Q12H  . phosphorus  500 mg Oral TID  . silver sulfADIAZINE  1 application Topical Daily

## 2013-07-02 NOTE — Consult Note (Signed)
WOC wound consult note Reason for Consult:evaluation of LE and sacrum. Pt with radiation burns circumferentially around the anus and on the posterior scrotum and penis. Wet desquamation with epithelial buds thru the wound bed.  He also has a large unstageable pressure ulcer on the sacrum that is 100% necrotic.  He has bilateral 2-3+ edema, with some superficial peeling of the skin on the plantar surface of the right foot secondary to edema. His toes and plantar surfaces are a mottled in appearance but they are warm and he has palpable pulses bilaterally. Not quite clear on the etiology of the discoloration but the cracking and peeling does appear to be related to edema.  Wound type: Unstageable pressure ulcer of the sacrum. Partial thickness skin loss on the right plantar foot from edema Pressure Ulcer POA: Yes Measurement: Sacrum: 5.5cm x 7cm x 0.2cm  Wound PIR:JJOACZ 100% yellow/grey slough, loose and fluctuant.  Drainage (amount, consistency, odor) moderate, serosanguinous, no odor Periwound: radiation burns around the rectum, scrotum, partial thickness skin loss, wet  Dressing procedure/placement/frequency: Enzymatic debridement ointment to the sacrum, and begin hydrotherapy with PT for debridement. Add air mattress for moisture control and offloading.  Zinc based barrier for radiation burns for protection from stool. Chair pad for offloading of the sacrum when up in the chair.  Eucerin for the LE and the dry skin of the feet.   [facility] team will follow along with you for wound assessment and progress.  Shacoya Burkhammer [place] RN,CWOCN [phone]

## 2013-07-03 DIAGNOSIS — K6289 Other specified diseases of anus and rectum: Principal | ICD-10-CM

## 2013-07-03 DIAGNOSIS — E43 Unspecified severe protein-calorie malnutrition: Secondary | ICD-10-CM

## 2013-07-03 MED ORDER — FENTANYL 25 MCG/HR TD PT72
25.0000 ug | MEDICATED_PATCH | TRANSDERMAL | Status: DC
  Administered 2013-07-04: 25 ug via TRANSDERMAL
  Filled 2013-07-03: qty 1

## 2013-07-03 MED ORDER — NALOXONE HCL 0.4 MG/ML IJ SOLN
INTRAMUSCULAR | Status: AC
  Administered 2013-07-04: 04:00:00
  Filled 2013-07-03: qty 1

## 2013-07-03 MED ORDER — NALOXONE HCL 0.4 MG/ML IJ SOLN
0.4000 mg | Freq: Once | INTRAMUSCULAR | Status: AC
  Administered 2013-07-03: 0.4 mg via INTRAVENOUS

## 2013-07-03 MED ORDER — OXYCODONE-ACETAMINOPHEN 5-325 MG PO TABS: 1.0000 | ORAL_TABLET | ORAL | Status: DC | PRN

## 2013-07-03 MED ORDER — LORAZEPAM 2 MG/ML IJ SOLN
0.5000 mg | Freq: Once | INTRAMUSCULAR | Status: AC
  Administered 2013-07-03: 0.5 mg via INTRAVENOUS
  Filled 2013-07-03: qty 1

## 2013-07-03 MED ORDER — FENTANYL CITRATE 0.05 MG/ML IJ SOLN
12.5000 ug | INTRAMUSCULAR | Status: DC | PRN
  Administered 2013-07-04: 12.5 ug via INTRAVENOUS
  Filled 2013-07-03: qty 2

## 2013-07-03 MED ORDER — LORAZEPAM 0.5 MG PO TABS
0.5000 mg | ORAL_TABLET | Freq: Once | ORAL | Status: DC
  Filled 2013-07-03: qty 1

## 2013-07-03 MED ORDER — LOPERAMIDE HCL 1 MG/5ML PO LIQD
2.0000 mg | ORAL | Status: DC | PRN
  Filled 2013-07-03: qty 10

## 2013-07-03 MED ORDER — OXYCODONE HCL 5 MG PO TABS: 2.5000 mg | ORAL_TABLET | ORAL | Status: DC | PRN

## 2013-07-03 NOTE — Progress Notes (Signed)
PT MOBILITY  EVAL    07/03/13 1100  PT Visit Information  Last PT Received On 07/03/13  History of Present Illness Brendan Mosley is a 63 y.o. male with history of rectal cancer currently undergoing radiation and chemotherapy.  Patient presents with generalized weakness and confusion that was noted by girlfriend.  She states she had been trying to keep him hydrated and keep his electrolytes up as best as she could but she is concerned that he has become dehydrated. Pt also has  perineaum/buttock radiation burns , also affecting palms and feet.  Precautions  Precautions Fall  Home Living  Family/patient expects to be discharged to: [place] other  Available Help at Discharge Family  Type of [place] to enter  Entrance Stairs-Number of Steps 2  [place] One level  [place] BSC;Walker - 2 wheels  Additional Comments pt vague in answering questions.  Prior Function  Level of [place] (??? pt unclear no family)  Cognition  Arousal/Alertness Lethargic;Suspect due to medications  Behavior During Therapy [facility] for tasks assessed/performed  Upper Extremity Assessment  Upper Extremity Assessment Generalized weakness  Lower Extremity Assessment  Lower Extremity Assessment Generalized weakness  RLE Deficits / Details diffuse muscle atrophy  LLE Deficits / Details same as R  Bed Mobility  Overal bed mobility Needs Assistance  Bed Mobility Supine to Sit;Sit to Supine  Sit to supine Min guard;Supervision  General bed mobility comments for safety; air mattress deflated  Transfers  Overall transfer level Needs assistance  Equipment used None  Transfers Sit to/from Stand  Sit to Stand Min assist  General transfer comment min assist for balance on initial stand, cues for safety  Ambulation/Gait  Ambulation/Gait assistance Min assist;+2 safety/equipment  Ambulation Distance (Feet) 80 Feet  Assistive device  1 person hand held assist  Gait Pattern/deviations Narrow base of support  Balance  Overall balance assessment Needs assistance  Sitting balance-Leahy Scale Poor  PT - End of Session  Activity Tolerance Patient limited by fatigue  Patient left in bed;with call bell/phone within reach  Nurse Communication Mobility status  PT Assessment  PT Recommendation/Assessment Patient needs continued PT services  PT Problem List Decreased strength;Decreased activity tolerance;Decreased balance;Decreased mobility  PT Therapy Diagnosis  Difficulty walking  PT Plan  PT Frequency Min 3X/week  PT Treatment/Interventions DME instruction;Gait training;Functional mobility training;Therapeutic activities;Therapeutic exercise;Patient/family education  PT Recommendation  Follow Up Recommendations Supervision/Assistance - 24 hour;SNF  PT equipment None recommended by PT  Acute Rehab PT Goals  PT Goal Formulation With patient  Time For Goal Achievement 07/10/13  Potential to Achieve Goals Good  PT Time Calculation  PT Start Time 1100  PT Stop Time 1115  PT Time Calculation (min) 15 min  PT General Charges  $$ ACUTE PT VISIT 1 Procedure  PT Treatments  $Gait Training 8-22 mins

## 2013-07-03 NOTE — Progress Notes (Signed)
Shift event: RN paged NP secondary to pt desatting into the 70s on 4L O2. Pt had to be sedated earlier secondary to fighting staff, pulling at IV, and trying to get OOB. Narcan ordered and NP to bedside. This is a very frail, cachetic appearing elderly AAM in NAD. Awakens to tactile stimuli, sternal rub. Respirations even and unlabored. O2 sat up to the high 80s with venti mask at 50% and up to mid 90s after Narcan. Pt awakens fully after Narcan and becomes beligerant. BP 120/60 manually during event. Sinus tachy in the 120-140 range.  1. Oversedation resolved with Narcan-will try different pain regimen with Fentanyl patch and shorter acting Fentanyl IV prn. Is on long acting pain meds as well due to cancer. Use restraints prn.  Pt back on [place] O2 and conts to sat in the 90s. Will cont to follow tonight.  Clance Boll, NP [facility]

## 2013-07-03 NOTE — Progress Notes (Signed)
07/03/13 1200    PT---HYDROTHERAPY---EVAL   Wound 07/03/13 Other (Comment) Sacrum Mid pt slough covered sacaral wound  Date First Assessed/Time First Assessed: 07/03/13 1115   Wound Type: (c) Other (Comment)  Location: Sacrum  Location Orientation: Mid  Wound Description (Comments): pt slough covered sacaral wound  Present on Admission: Yes  Site / Wound Assessment Painful;Dusky  % Wound base Red or Granulating 0%  % Wound base Other (Comment) 100% (greyish slough)  Peri-wound Assessment Other (Comment) (radiation burns periwound)  Wound Length (cm) 5 cm  Wound Width (cm) 4 cm  Closure None  Drainage Amount Minimal  Drainage Description Other (Comment) (greyish brown)  Non-staged Wound Description Full thickness  Treatment Cleansed;Debridement (Selective);Hydrotherapy (Pulse lavage);Packing (Saline gauze);Other (Comment)  Dressing Type Barrier Film (skin prep);Moist to dry;Moisture barrier;Other (Comment) (Enzymatic deb ointment)  Dressing Changed New  Dressing Status None  Hydrotherapy  Pulsed Lavage with Suction (psi) 8 psi  Pulsed Lavage with Suction - Normal Saline Used 1000 mL  Pulsed Lavage Tip Tip with splash shield  Pulsed lavage therapy - wound location SACRUM  Wound Therapy - Assess/Plan/Recommendations  Wound Therapy - Clinical Statement pt will benefit from hydrotherapy to facilitate wound healing through cleansing, debridement and pt education  Wound Therapy - Functional Problem List decrmobility due to pain, painful stitting and transitions  Factors Delaying/Impairing Wound Healing Multiple medical problems  Hydrotherapy Plan Debridement;Dressing change;Patient/family education;Pulsatile lavage with suction  Wound Therapy - Frequency 6X / week  Wound Therapy - Follow Up Recommendations Skilled nursing facility  Wound Therapy Goals - Improve the function of patient's integumentary system by progressing the wound(s) through the phases of wound healing by:  Decrease  Necrotic Tissue to 70  Decrease Necrotic Tissue - Progress Goal set today  Increase Granulation Tissue to 30  Increase Granulation Tissue - Progress Goal set today  Goals/treatment plan/discharge plan were made with and agreed upon by patient/family Yes  Time For Goal Achievement 2 weeks  Wound Therapy - Potential for Goals Good

## 2013-07-03 NOTE — Progress Notes (Signed)
[facility] PROGRESS NOTE  Brendan Mosley OIZ:[id number] DOB: 1951-04-11 DOA: 07-29-13 PCP: No PCP Per Patient  Brief narrative: 63 year old male with history of rectal cancer status post radiation who was admitted 29-Jul-2013 with persistent diarrhea and rectal pain. No fevers, no blood in stool. On admission, CBC revealed hemoglobin of 9.6 and platelet count of 144. BMP revealed sodium of 136 and normal creatinine.   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 - C.diff negative  - continue lomotil TID and add imodium PRN Active Problems:  Hypokalemia  - due to GI losses  - repleted  LE edema and necrotic toes  - appreciated wound care consult  - continue lasix 40 mg IV BID; still significant edema on physical exam Protein-calorie malnutrition, severe  - nutrition consulted   Code Status: full code  Family Communication: no family at the bedside  Disposition Plan: remains inpatient    Consultants:  Wound care Procedures:  None  Antibiotics:  None    Leisa Lenz, MD  [facility] Pager [phone]  If 7PM-7AM, please contact night-coverage [email or web address] Password [email or web address] 07/03/2013, 4:11 PM   LOS: 3 days    HPI/Subjective: Still with pedal edema but diarrhea improved with lomotil.  Objective: Filed Vitals:   07/02/13 0729 07/02/13 1355 07/02/13 2222 07/03/13 0536  BP: 122/71 120/78 93/65 138/71  Pulse: 121 127 110 117  Temp: 98.3 F (36.8 C) 99.7 F (37.6 C) 98.9 F (37.2 C) 99.1 F (37.3 C)  TempSrc: Oral Oral Oral Oral  Resp: 20 18 20 20   Height:      Weight:      SpO2: 92% 93% 95% 87%    Intake/Output Summary (Last 24 hours) at 07/03/13 1611 Last data filed at 07/03/13 0537  Gross per 24 hour  Intake      0 ml  Output   1575 ml  Net  -1575 ml    Exam:   General:  Pt is alert, follows commands appropriately, not in acute distress  Cardiovascular: Regular rate and rhythm, S1/S2  appreciated   Respiratory: Clear to auscultation bilaterally, no wheezing  Abdomen: Soft, non tender, non distended, bowel sounds present, no guarding  Extremities:_+3 pedal pitting edema, pulses DP and PT palpable bilaterally  Neuro: Grossly nonfocal  Data Reviewed: Basic Metabolic Panel:  Recent Labs Lab 2013-07-29 0127 2013/07/29 1639 07/01/13 0127 07/01/13 0830 07/02/13 0708  NA  --  136* 137  --  137  K  --  2.2* 2.5*  2.5* 3.2* 3.2*  CL  --  101 104  --  102  CO2  --  21 21  --  22  GLUCOSE  --  92 90  --  94  BUN  --  10 9  --  11  CREATININE  --  0.83 0.76  --  0.84  CALCIUM  --  7.4* 6.8*  --  7.0*  MG 1.7  --   --   --   --    Liver Function Tests:  Recent Labs Lab 07-29-2013 1639  AST 32  ALT 31  ALKPHOS 126*  BILITOT 0.3  PROT 4.8*  ALBUMIN 1.5*   No results found for this basename: LIPASE, AMYLASE,  in the last 168 hours No results found for this basename: AMMONIA,  in the last 168 hours CBC:  Recent Labs Lab 29-Jul-2013 1639 07/02/13 0708  WBC 5.1 3.3*  NEUTROABS 4.6 2.9  HGB 9.6* 9.4*  HCT 27.6* 27.3*  MCV 78.0 79.6  PLT 144* 118*   Cardiac Enzymes:  Recent Labs Lab 07/13/2013 1639  TROPONINI <0.30   BNP: No components found with this basename: POCBNP,  CBG:  Recent Labs Lab 06/26/13 1647 07/02/13 1659  GLUCAP 76 109*    Recent Results (from the past 240 hour(s))  URINE CULTURE     Status: None   Collection Time    06/25/13  1:35 AM      Result Value Range Status   Specimen Description URINE, CLEAN CATCH   Final   Special Requests NONE   Final   Culture  Setup Time     Final   Value: 06/25/2013 09:00     Performed at [place]     Final   Value: NO GROWTH     Performed at [facility]   Culture     Final   Value: NO GROWTH     Performed at [facility]   Report Status 06/26/2013 FINAL   Final  CLOSTRIDIUM DIFFICILE BY PCR     Status: None   Collection Time    07/11/2013  9:45 PM       Result Value Range Status   C difficile by pcr NEGATIVE  NEGATIVE Final   Comment: Performed at [facility]     Studies: No results found.  Scheduled Meds: . collagenase   Topical Daily  . diphenoxylate-atropine  1 tablet Oral QID  . enoxaparin (LOVENOX) injection  40 mg Subcutaneous QHS  . ferrous sulfate  325 mg Oral BID PC  . furosemide  40 mg Intravenous BID  . hydrocerin   Topical Daily  . hydrocortisone   Rectal BID  . OxyCODONE  15 mg Oral Q12H  . phosphorus  500 mg Oral TID  . silver sulfADIAZINE  1 application Topical Daily   Continuous Infusions:

## 2013-07-03 NOTE — ED Provider Notes (Signed)
Medical screening examination/treatment/procedure(s) were performed by non-physician practitioner and as supervising physician I was immediately available for consultation/collaboration.  EKG Interpretation    Date/Time:  Sunday June 30 2013 17:19:32 EST Ventricular Rate:  90 PR Interval:  148 QRS Duration: 95 QT Interval:  377 QTC Calculation: 461 R Axis:   80 Text Interpretation:  Sinus rhythm Nonspecific repol abnormality, diffuse leads ED PHYSICIAN INTERPRETATION AVAILABLE IN [facility] Confirmed by TEST, RECORD ([id number]) on 07/02/2013 10:26:48 AM              Kathalene Frames, MD 07/03/13 [phone]

## 2013-07-03 NOTE — Progress Notes (Signed)
Patient was found to have: pulse 140's. BP 89/54 and O2 desaturating in the low 70's.  RR 20.  Rapid response team was notified, MD, and respiratory also paged.  Patient was briefly switched from a nasal cannula to a non-rebreather mask.  Patient was also administered 0.4mg  Narcan.  Patient's BP came up to 120/60 manual (checked by NP).  Also patient's 02 came up to 95% on 4L (will wean as patient wakes up).  Pulse still elevated at 147 d/t narcan.  Will continue to monitor and will make sure pulse decreases.  Patient continues to scream out in pain.  A small amount of pain medication 0.5mg  dilaudid was given after narcan administered per md orders.  Will continue to monitor patient.Azzie Glatter [place]

## 2013-07-04 ENCOUNTER — Inpatient Hospital Stay (HOSPITAL_COMMUNITY): Payer: BC Managed Care – PPO

## 2013-07-04 MED ORDER — VANCOMYCIN HCL IN DEXTROSE 750-5 MG/150ML-% IV SOLN
750.0000 mg | Freq: Two times a day (BID) | INTRAVENOUS | Status: DC
  Filled 2013-07-04: qty 150

## 2013-07-04 MED ORDER — SCOPOLAMINE 1 MG/3DAYS TD PT72
1.0000 | MEDICATED_PATCH | TRANSDERMAL | Status: DC
  Administered 2013-07-04: 1.5 mg via TRANSDERMAL
  Filled 2013-07-04: qty 1

## 2013-07-04 MED ORDER — SODIUM CHLORIDE 0.9 % IV BOLUS (SEPSIS)
500.0000 mL | Freq: Once | INTRAVENOUS | Status: AC
  Administered 2013-07-04: 500 mL via INTRAVENOUS

## 2013-07-04 MED ORDER — PIPERACILLIN-TAZOBACTAM 3.375 G IVPB
3.3750 g | Freq: Three times a day (TID) | INTRAVENOUS | Status: DC
  Filled 2013-07-04: qty 50

## 2013-07-04 MED ORDER — ACETAMINOPHEN 160 MG/5ML PO SOLN
ORAL | Status: AC
  Filled 2013-07-04: qty 20.3

## 2013-07-04 MED ORDER — ACETAMINOPHEN 325 MG PO TABS: 650.0000 mg | ORAL_TABLET | Freq: Four times a day (QID) | ORAL | Status: DC | PRN

## 2013-07-04 MED ORDER — NALOXONE HCL 0.4 MG/ML IJ SOLN
INTRAMUSCULAR | Status: AC
  Administered 2013-07-04: 04:00:00
  Filled 2013-07-04: qty 1

## 2013-07-04 MED ORDER — LORAZEPAM 2 MG/ML IJ SOLN: 1.0000 mg | INTRAMUSCULAR | Status: DC | PRN

## 2013-07-04 MED ORDER — MORPHINE SULFATE 10 MG/ML IJ SOLN
1.0000 mg/h | INTRAMUSCULAR | Status: DC
  Administered 2013-07-04: 3 mg/h via INTRAVENOUS
  Filled 2013-07-04: qty 10

## 2013-07-04 MED ORDER — DEXTROSE-NACL 5-0.9 % IV SOLN
INTRAVENOUS | Status: DC
  Administered 2013-07-04: 01:00:00 via INTRAVENOUS

## 2013-07-04 MED ORDER — MORPHINE SULFATE 4 MG/ML IJ SOLN
4.0000 mg | Freq: Once | INTRAMUSCULAR | Status: AC
  Administered 2013-07-04: 4 mg via INTRAVENOUS

## 2013-07-04 MED ORDER — DEXTROSE 5 % IV SOLN
30.0000 ug/min | Freq: Once | INTRAVENOUS | Status: DC
  Filled 2013-07-04: qty 1

## 2013-07-18 ENCOUNTER — Ambulatory Visit: Payer: BC Managed Care – PPO | Admitting: Radiation Oncology

## 2013-07-28 NOTE — Progress Notes (Signed)
Morphine drip 34ml of 129ml bag watsed with Festus Aloe RN/ Filomena Jungling RN

## 2013-07-28 NOTE — Progress Notes (Signed)
Shift event: RN paged NP secondary to pt being unresponsive with O2 sat in the 70s and BP in the 70s. T 103.4 axillary. Ordered saline bolus and narcan again and NP to bedside. Pt at this time is a FULL CODE.  Upon arrival, pt is obtunded and non responsive to tactile or verbal stimulation. Ventimask on with O2 sats in the 80s and agonal breathing. Pulse regular. Pt immediately transferred to ICU anticipating intubation. Upon arrival in ICU, pt moaning and still with increased WOB and resp distress. O2 sats remain in the 80s. BP in the 60s. Dr. Shanon Brow of [facility] and anesthesia paged stat to bedside. PCCM on camera in room. This NP spoke to son, Brendan Mosley, and verified that he is the next of kin. There is a spouse listed in the chart but Brendan Mosley says pt has no spouse. There is no written POA, so decision falls on son, Brendan Mosley. After explanation of events, including probability of pt being septic and needing to be intubated secondary to respiratory status, Kelvin, expressed desire for pt to be a DNR/DNI. When asked about pressors and antibiotics, Kelvin replied no, "don't do anything else". I explained comfort care and that we would let "nature takes it course" and Kelvin agreed, stating he didn't want anything else done given his father's terminal condition (cancer and severe protein calorie malnutrition among others). Dr. Earnest Conroy of PCCM agrees with decision as well. Dr. Earnest Conroy informing Dr. Titus Mould as well who is the "floor" doc for PCCM tonight.  Family asked to be called when pt expires and are not planning on coming to hospital at this time. Clance Boll, NP [facility]

## 2013-07-28 NOTE — Progress Notes (Signed)
Patient 02. Sat 95% on 2l now and HR 128. Patient is receiving a 500 cc bolus right now.  Patient was hooked up to continuous pulse ox monitor.  Fentanyl patch was placed on patient's left arm.Brendan Mosley

## 2013-07-28 NOTE — Progress Notes (Signed)
Pt expired at 0530. Family called by RN. Death certificate completed.  Clance Boll, NP [facility]

## 2013-07-28 NOTE — Progress Notes (Signed)
Pt arrived to unit with noted increased work of breathing and responsive only to noxiuos/painful stimuli. Pt code status changed to a DNR. Pt placed on comfort measures and morphine drip was started. 0520 pt respirations became agonal. 0530 pt noted to have no respirations at the time along with no breath sounds on auscultation and PEA on the monitor. No pulses were palpable and no heart tones were auscultated and verified by 2 nurses. Family made aware of time of death.  Addalyn Speedy RN Benjamin Stain RN

## 2013-07-28 NOTE — Discharge Summary (Signed)
  Death Summary  Efrem Pitstick INO:[id number] DOB: Apr 27, 1951 DOA: 07/25/2013  PCP: No PCP Per Patient PCP/Office notified:   Admit date: 07-25-2013 Date of Death: 08/10/13  Final Diagnoses:  Principal Problem:   Hypokalemia Active Problems:   Diarrhea   Protein-calorie malnutrition, severe  History of present illness:  63 year old male with history of rectal cancer status post radiation who was admitted 25-Jul-2013 with persistent diarrhea and rectal pain. No fevers, no blood in stool. On admission, CBC revealed hemoglobin of 9.6 and platelet count of 144. BMP revealed sodium of 136 and normal creatinine.   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

## 2013-07-28 NOTE — Progress Notes (Signed)
Patient around 0300 am became more lethargic, stuporous, and minimally responsive.  Patient's skin was extremely hot to touch.  Axillary temperature was taken and it was 103.1.  MD notified and several orders received.  Attempted giving pt. Tylenol but unsuccessful d/t inability to swallow.  Also patient having continuous stools so PR was a difficult option as well.  Patient appeared to be quickly crashing.  02 saturation began to stay in the low 80's even on a venti-mask.  Patient breathing was labored.  BP 60's/30's and pulse tachy 130's.NP Clance Boll was notified to come look at patient. She quickly came to Bedside since patient still full code.  [name] felt he was septic so we immediately transferred patient to ICU.  Son Brendan Mosley was notified and at first wanted everything to be done.  Son called back after speaking with his uncle and notified this RN and [place] NP that he wanted the patient to be full comfort care-DNR.  Son reported that father did not have a HCPOA, so next of kin was to make decision.  Patient started on a morphine drip and is now being kept comfortable.  Family declined to see patient and wanted to be notified at the time of patient death.Azzie Glatter [place]

## 2013-07-28 NOTE — Progress Notes (Signed)
MD order for restraints placed due to patient being agitated and restless.  Restraints have not been placed yet.  Will only be started if absolutely necessary.  Will continue to monitor patient and will initiate restraints if needed.Azzie Glatter [place]

## 2013-07-28 DEATH — deceased

## 2015-02-19 IMAGING — CT CT ABD-PELV W/ CM
2 of 5 series · 16 of 46 positions shown, 18 images · IV contrast (OMNIPAQUE)
Comparison: 04/05/2013

CLINICAL DATA: Followup metastatic rectal carcinoma. Ongoing
chemotherapy and radiation therapy.

EXAM:
CT CHEST, ABDOMEN, AND PELVIS WITH CONTRAST
TECHNIQUE: Multidetector CT imaging of the chest, abdomen and pelvis was
performed following the standard protocol during bolus
administration of intravenous contrast.
CONTRAST:  100mL OMNIPAQUE IOHEXOL 300 MG/ML  SOLN

[Series 2: cap with st · axial · 0.83mm/px · z∈[-581,-26]mm · 13 of 125 slices shown, 15 images]
[im 7/125  soft-tissue]
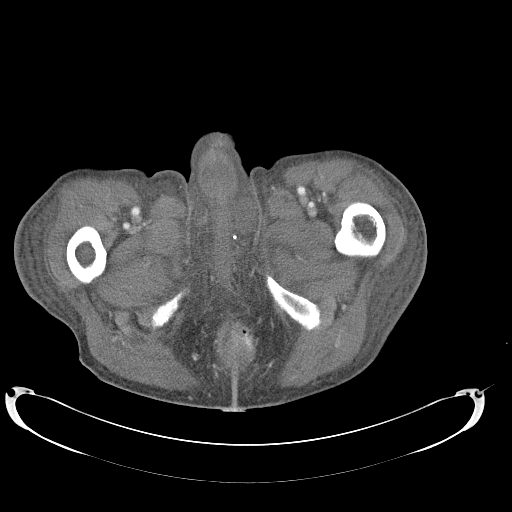
[im 7/125  bone]
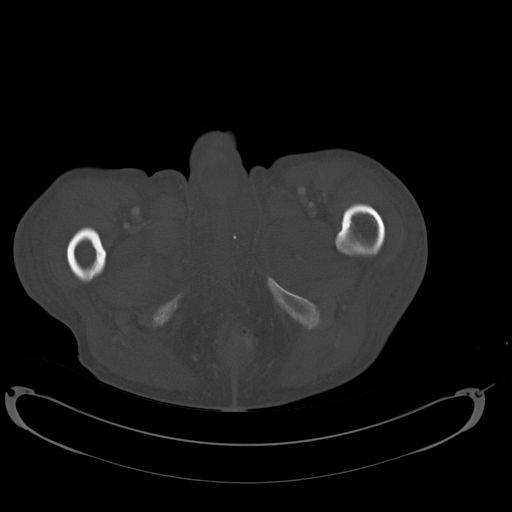
[im 20/125  soft-tissue]
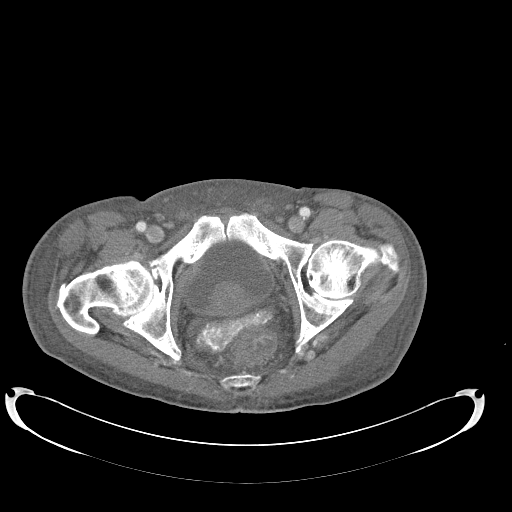
[im 27/125  soft-tissue]
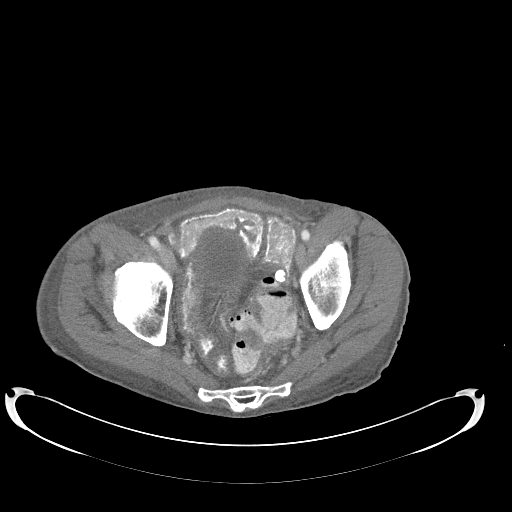
[im 33/125  soft-tissue]
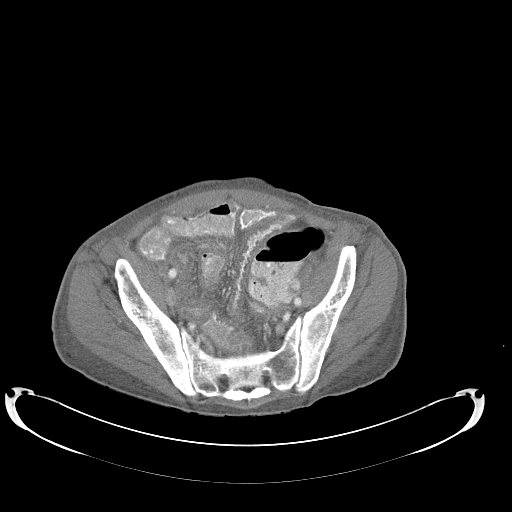
[im 46/125  soft-tissue]
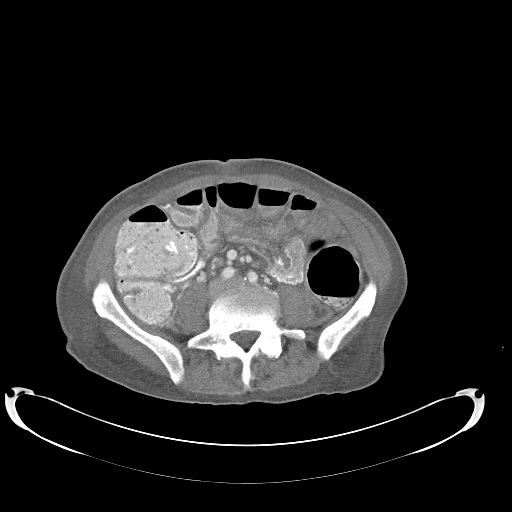
[im 53/125  soft-tissue]
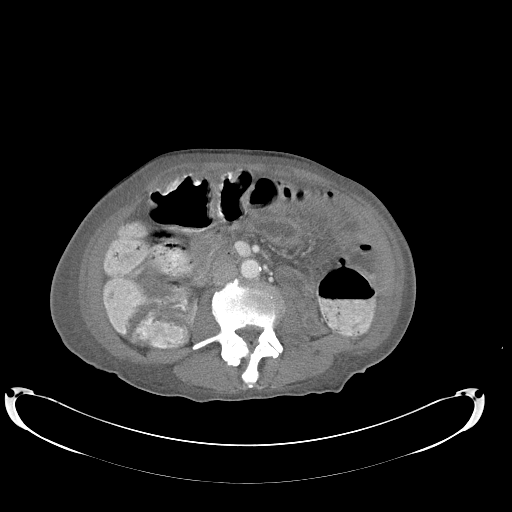
[im 66/125  soft-tissue]
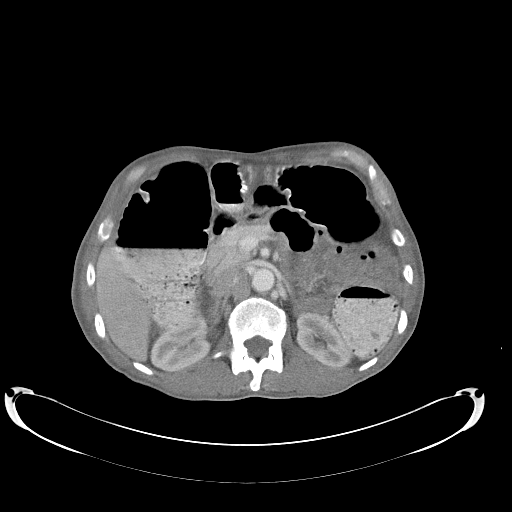
[im 72/125  soft-tissue]
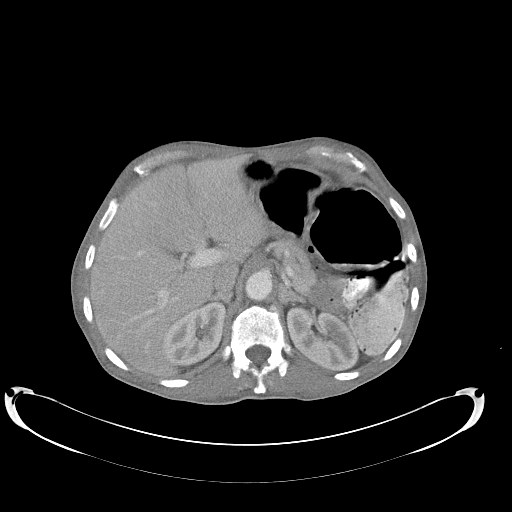
[im 79/125  soft-tissue]
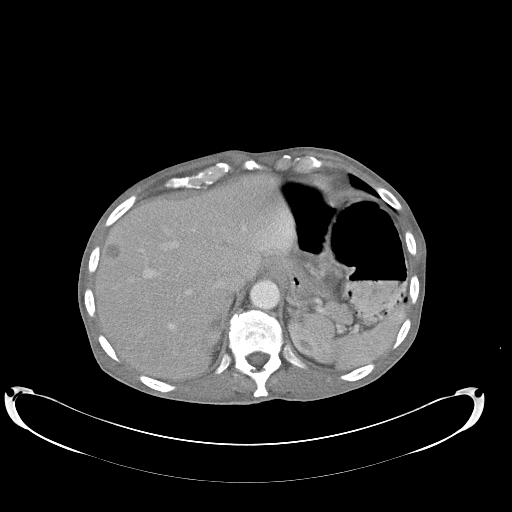
[im 79/125  bone]
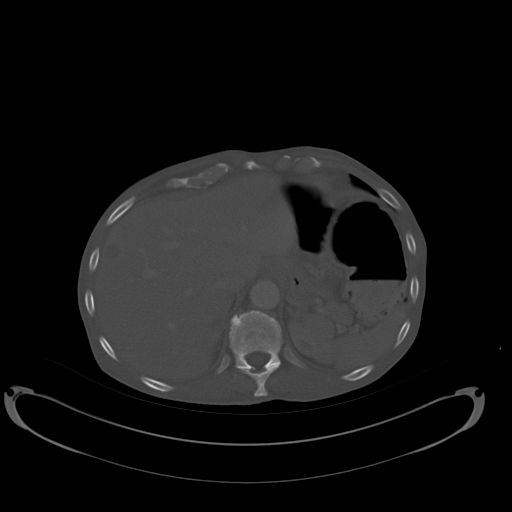
[im 92/125  soft-tissue]
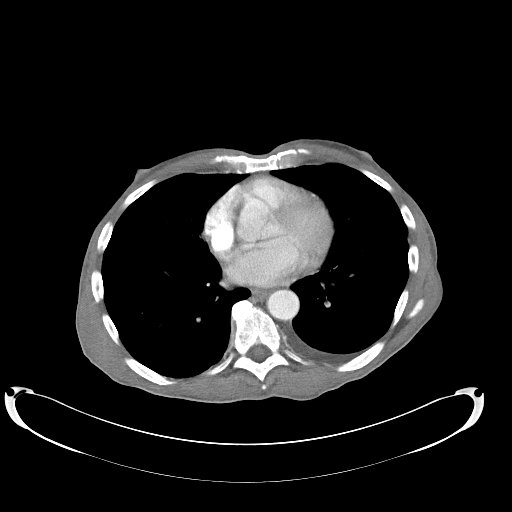
[im 98/125  soft-tissue]
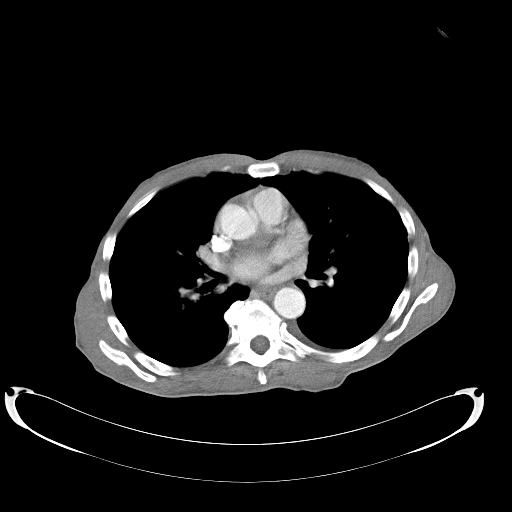
[im 105/125  soft-tissue]
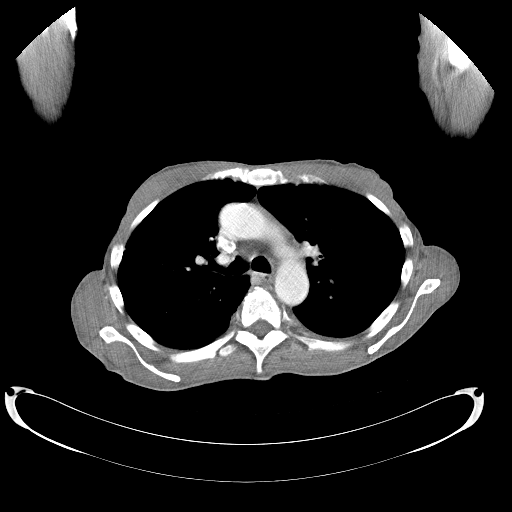
[im 118/125  soft-tissue]
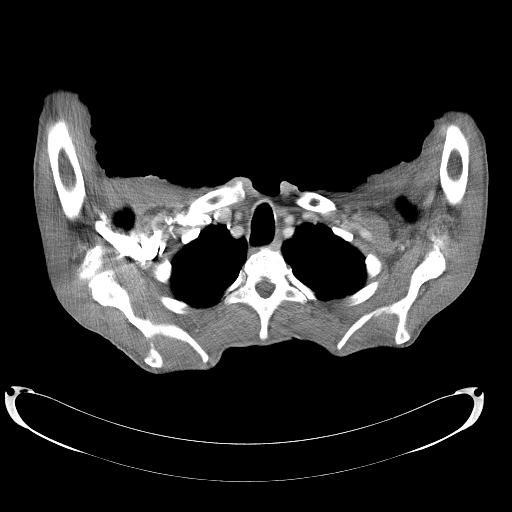

[Series 602: cor · coronal · 1.22mm/px · 3 of 75 slices shown]
[im 25/75  soft-tissue]
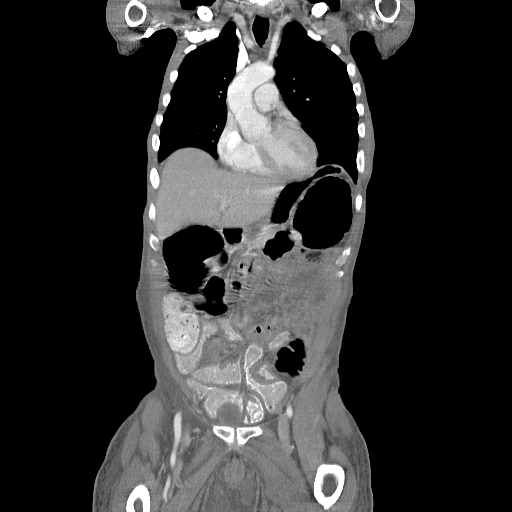
[im 33/75  soft-tissue]
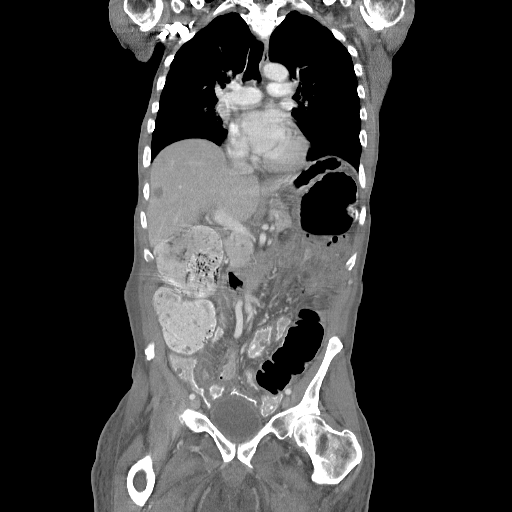
[im 42/75  soft-tissue]
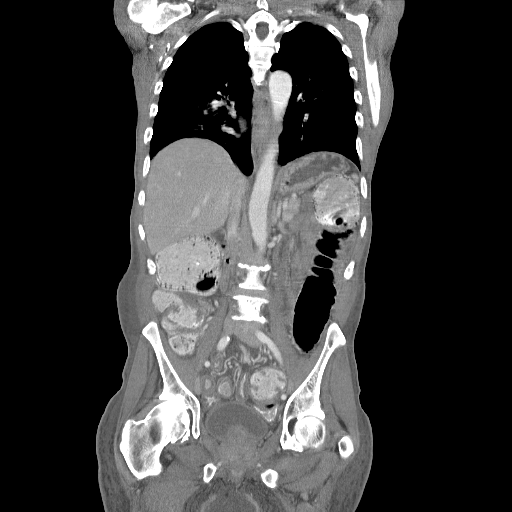

[16 of 46 positions shown; findings below may reference images not displayed]

FINDINGS: CT CHEST FINDINGS

No evidence of mediastinal or hilar masses. No adenopathy seen
elsewhere within the thorax. A new small left pleural effusion is
seen. Mild patchy areas of airspace disease are seen in both upper
lobes which are new, and may be related drug reaction, although
infection cannot definitely be excluded. No discrete pulmonary
nodules or masses are identified.

CT ABDOMEN AND PELVIS FINDINGS

Diffuse body wall and mesenteric edema noted which is new since
previous study. Concentric rectal wall thickening again seen,
consistent with mass. Left perirectal lymphadenopathy measuring
x 2.1 cm on image 111 shows no significant change. No other
lymphadenopathy identified within the abdomen or pelvis.

Hypovascular metastasis in the anterior segment of the right hepatic
lobe currently measures 1.3 x 1.4 cm on image 48 compared with 1.9 x
2.3 cm previously. No other liver lesions are identified.

The pancreas, spleen, adrenal glands, and kidneys are normal in
appearance. No evidence hydronephrosis. Enlarged prostate again
noted. No suspicious bone lesions identified for
IMPRESSION: No significant change in concentric rectal mass and mild left
perirectal lymphadenopathy.

Decreased size of metastatic lesion in the anterior right hepatic
lobe. No new or progressive metastatic disease identified.

Increased diffuse body wall and mesenteric edema.

New small left pleural effusion and patchy bilateral upper lobe
airspace disease, suspicious for drug reaction with infection
considered less likely.
